# Patient Record
Sex: Female | Born: 1995 | Hispanic: Yes | Marital: Single | Smoking: Never smoker
Health system: Southern US, Community
[De-identification: ages and names within clinical notes are randomized; demographics above are authoritative.]

## PROBLEM LIST (undated history)

## (undated) DIAGNOSIS — N390 Urinary tract infection, site not specified: Secondary | ICD-10-CM

## (undated) DIAGNOSIS — M25361 Other instability, right knee: Secondary | ICD-10-CM

## (undated) DIAGNOSIS — J45909 Unspecified asthma, uncomplicated: Secondary | ICD-10-CM

## (undated) DIAGNOSIS — F419 Anxiety disorder, unspecified: Secondary | ICD-10-CM

## (undated) DIAGNOSIS — Z8619 Personal history of other infectious and parasitic diseases: Secondary | ICD-10-CM

## (undated) DIAGNOSIS — B009 Herpesviral infection, unspecified: Secondary | ICD-10-CM

## (undated) HISTORY — PX: WISDOM TOOTH EXTRACTION: SHX21

## (undated) HISTORY — PX: COLONOSCOPY: SHX174

---
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
## 2017-01-02 ENCOUNTER — Ambulatory Visit: Payer: Medicaid Other | Admitting: Physical Therapy

## 2017-01-02 DIAGNOSIS — M25661 Stiffness of right knee, not elsewhere classified: Secondary | ICD-10-CM

## 2017-01-02 DIAGNOSIS — R262 Difficulty in walking, not elsewhere classified: Secondary | ICD-10-CM

## 2017-01-02 DIAGNOSIS — R2689 Other abnormalities of gait and mobility: Secondary | ICD-10-CM

## 2017-01-02 DIAGNOSIS — M25361 Other instability, right knee: Secondary | ICD-10-CM

## 2017-01-02 DIAGNOSIS — R600 Localized edema: Secondary | ICD-10-CM

## 2017-01-02 DIAGNOSIS — M25561 Pain in right knee: Secondary | ICD-10-CM

## 2017-01-02 DIAGNOSIS — M6281 Muscle weakness (generalized): Secondary | ICD-10-CM

## 2017-01-02 NOTE — Therapy (Signed)
[place], [place], [address] Phone: [phone]   Fax:  [phone]  Physical Therapy Treatment  Patient Details  Name: Suzanne Wolfe MRN: [id number] Date of Birth: April 10, 1996 Referring Provider: Dannielle Karvonen rogers MD  Encounter Date: 01/02/2017      PT End of Session - 01/02/17 1620    Visit Number 13   Number of Visits 24   Date for PT Re-Evaluation 01/02/17   Authorization Type MCD   Authorization Time Period 11/21/2016-01/15/2017   Authorization - Visit Number 12   Authorization - Number of Visits 24   PT Start Time 6283   PT Stop Time 0343   PT Time Calculation (min) 46 min      Past Medical History:  Diagnosis Date  . History of Clostridium difficile infection 02/ 2018  resolve w/ antibiotic  . Mild asthma   . Patellar instability of right knee     Past Surgical History:  Procedure Laterality Date  . COLONOSCOPY  08-04-2016  dr stark  . ESOPHAGOGASTRODUODENOSCOPY  06/20/2016  . LAPAROSCOPIC APPENDECTOMY  07/16/2009  . MEDIAL PATELLOFEMORAL LIGAMENT REPAIR Right 10/31/2016   Procedure: Right knee arthroscopic assisted medial patella femoral ligament reconstruction;  Surgeon: Nicholes Stairs, MD;  Location: [facility];  Service: Orthopedics;  Laterality: Right;  . PILONIDAL CYST EXCISION N/A 07/02/2014   Procedure: EXCISION OF PILONIDAL CYST WITH PRIMARY CLOSURE;  Surgeon: Jerilynn Mages. Gerald Stabs, MD;  Location: [place];  Service: Pediatrics;  Laterality: N/A;  . PILONIDAL CYST EXCISION N/A 10/22/2014   Procedure: EXCISION PILONIDAL CYST AND SINUS;  Surgeon: Gerald Stabs, MD;  Location: [place];  Service: Pediatrics;  Laterality: N/A;  Re-excision  . WISDOM TOOTH EXTRACTION      There were no vitals filed for this visit.      Subjective Assessment - 01/02/17 1459    Subjective Only have pain if I walk alot like at the mall for an hour.    Currently in Pain? No/denies            [facility] PT Assessment - 01/02/17 0001      AROM   Left Knee Flexion 86  95 at end of treatment                      [facility] Adult PT Treatment/Exercise - 01/02/17 0001      Ambulation/Gait   Ambulation/Gait Yes   Pre-Gait Activities roll through and heel strike forward and retro gait in parallel bars      Knee/Hip Exercises: Stretches   Hip Flexor Stretch Right;2 reps;30 seconds   Knee: Self-Stretch Limitations scoot strech at edge of mat      Knee/Hip Exercises: Aerobic   Stationary Bike 4 minutes level 1      Knee/Hip Exercises: Machines for Strengthening   Cybex Leg Press 25# bilateral      Knee/Hip Exercises: Standing   Terminal Knee Extension Limitations 2 x 10   blue band 3 way    Forward Step Up 2 sets;10 reps;Right;Step Height: 6";Hand Hold: 1   Wall Squat 2 sets;15 reps   Gait Training forward/ retro walking in // 10 x   using mirror for cues and verbal cues to heel strike/ toe of     Knee/Hip Exercises: Seated   Sit to Sand 2 sets;10 reps  elevated seat      Knee/Hip Exercises: Supine   Straight Leg Raises 10 reps  Straight Leg Raises Limitations with initial quad set                  PT Short Term Goals - 12/06/16 1743      PT SHORT TERM GOAL #1   Title pt will be I with inital HEP (12/07/2016)   Time 4   Period Weeks     PT SHORT TERM GOAL #2   Title pt will increase R knee PROM / AAROM knee flexion to >/= 80 degrees with </= 6/10 for functional therapuetic progression (12/07/2016)   Baseline AROM 76,    Time 4   Period Weeks   Status Partially Met     PT SHORT TERM GOAL #3   Title pt will be able to elicit strong quad contraction and demo SLR wiht </= -15 extensor lag to demonstrate improvement knee function (12/07/2016)   Baseline quad lag with SLR   Time 4   Period Weeks   Status On-going     PT SHORT TERM GOAL #4   Title pt will be able to don/ doff her brace Independently    Period Weeks           PT Long Term Goals - 12/06/16 1744      PT LONG TERM GOAL #1   Title pt will increase knee flexion to >/= 120 degrees and </= -2 degrees with </= 2/10 pain for function and efficient gait pattern (01/02/2017)   Baseline 76 degrees flexion   Time 8   Period Weeks   Status On-going     PT LONG TERM GOAL #2   Title improve R knee/ hip strength to >/= 4/5 to provide hip/ knee stability with walking/ standing (01/02/2017)   Time 8   Period Weeks   Status On-going     PT LONG TERM GOAL #3   Title pt will be able to walk / standing for >/= 30 min and navigate up/down >/= 10 stairs reciprocally with </= +1HHA for safety for functional mobility reqired for school and work (01/02/2017)   Time 8   Period Weeks   Status On-going     PT LONG TERM GOAL #4   Title pt will be I with all HEP given as of last visit (01/02/2017)   Time 8   Period Weeks   Status On-going     PT LONG TERM GOAL #5   Title Pt will be able to stand on Rt. LE for min dynamic activity and no LOB, no pain.    Time 8   Period Weeks   Status On-going               Plan - 01/02/17 1501    Clinical Impression Statement Pt enters without AD and hinge brace. She sees MD Friday. SHe has decreased heel strike and lacks full extension with gait. Flexion ROM limited to 86 which has not improved over her two week  absence from PT. Worked on partial squats, terminal knee extension and gait mechanics. Reviewed flexion stretches and encouraged increased compliance. After stretches AROM flexion 95.    PT Next Visit Plan  mini step-up,  hip and ankle strength in sitting/ standing, leg press , knee mobs/ patellar mobs, continued stool scoots for knee flexion, cross friction massage, trial taping if scabs have cleared up.    PT Home Exercise Plan heel slide using strap (stay within pain free range), quad set, glute set, ankle pumps, standing heel raise, wall squat, heel strike/  toe off with crutch.    Consulted and Agree with Plan of Care Patient   Family Member Consulted Mother      Patient will benefit from skilled therapeutic intervention in order to improve the following deficits and impairments:  Decreased range of motion, Increased fascial restricitons, Pain, Impaired flexibility, Increased edema, Decreased strength, Decreased mobility, Decreased endurance, Decreased activity tolerance  Visit Diagnosis: Patellar instability of right knee  Muscle weakness (generalized)  Localized edema  Other abnormalities of gait and mobility  Stiffness of right knee, not elsewhere classified  Difficulty in walking, not elsewhere classified  Acute pain of right knee     Problem List Patient Active Problem List   Diagnosis Date Noted  . Patellar instability of right knee 10/31/2016  . Infected pilonidal cyst 10/22/2014  . Pilonidal sinus 07/02/2014  . Episodic tension-type headache, not intractable 12/04/2013  . Migraine without aura and without status migrainosus, not intractable 12/04/2013    Dorene Ar, PTA 01/02/2017, 4:20 PM  [facility] [address] [place], [place], [address] Phone: [phone]   Fax:  [phone]  Name: Suzanne Wolfe MRN: [id number] Date of Birth: 1996-02-16

## 2017-01-03 ENCOUNTER — Encounter: Payer: Self-pay | Admitting: Physical Therapy

## 2017-01-03 ENCOUNTER — Ambulatory Visit: Payer: Medicaid Other | Admitting: Physical Therapy

## 2017-01-03 DIAGNOSIS — R600 Localized edema: Secondary | ICD-10-CM

## 2017-01-03 DIAGNOSIS — M25361 Other instability, right knee: Secondary | ICD-10-CM

## 2017-01-03 DIAGNOSIS — M25661 Stiffness of right knee, not elsewhere classified: Secondary | ICD-10-CM

## 2017-01-03 DIAGNOSIS — R2689 Other abnormalities of gait and mobility: Secondary | ICD-10-CM

## 2017-01-03 DIAGNOSIS — M6281 Muscle weakness (generalized): Secondary | ICD-10-CM

## 2017-01-03 NOTE — Therapy (Signed)
[place], [place], [address] Phone: [phone]   Fax:  [phone]  Physical Therapy Treatment / Re-certification  Patient Details  Name: Suzanne Wolfe MRN: [id number] Date of Birth: 1995/06/21 Referring Provider: Dannielle Karvonen rogers MD  Encounter Date: 01/03/2017      PT End of Session - 01/03/17 1458    Visit Number 14   Number of Visits 24   Date for PT Re-Evaluation 02/14/17   Authorization Type MCD   PT Start Time 1459   PT Stop Time 1546   PT Time Calculation (min) 47 min   Activity Tolerance Patient tolerated treatment well   Behavior During Therapy [facility] for tasks assessed/performed      Past Medical History:  Diagnosis Date  . History of Clostridium difficile infection 02/ 2018  resolve w/ antibiotic  . Mild asthma   . Patellar instability of right knee     Past Surgical History:  Procedure Laterality Date  . COLONOSCOPY  08-04-2016  dr stark  . ESOPHAGOGASTRODUODENOSCOPY  06/20/2016  . LAPAROSCOPIC APPENDECTOMY  07/16/2009  . MEDIAL PATELLOFEMORAL LIGAMENT REPAIR Right 10/31/2016   Procedure: Right knee arthroscopic assisted medial patella femoral ligament reconstruction;  Surgeon: Nicholes Stairs, MD;  [facility]: [facility];  Service: Orthopedics;  Laterality: Right;  . PILONIDAL CYST EXCISION N/A 07/02/2014   Procedure: EXCISION OF PILONIDAL CYST WITH PRIMARY CLOSURE;  Surgeon: Jerilynn Mages. Gerald Stabs, MD;  Location: [place];  Service: Pediatrics;  Laterality: N/A;  . PILONIDAL CYST EXCISION N/A 10/22/2014   Procedure: EXCISION PILONIDAL CYST AND SINUS;  Surgeon: Gerald Stabs, MD;  Location: [place];  Service: Pediatrics;  Laterality: N/A;  Re-excision  . WISDOM TOOTH EXTRACTION      There were no vitals filed for this visit.      Subjective Assessment - 01/03/17 1459    Subjective "I am feeling sore today, and the inside of my knee  is still sensitive"    Currently in Pain? Yes   Pain Score 1    Pain Orientation Right   Pain Type Surgical pain   Pain Onset More than a month ago   Pain Frequency Intermittent   Aggravating Factors  deep knee bending, then going back to a straigh position   Pain Relieving Factors meds, ice            [facility] PT Assessment - 01/03/17 1516      AROM   Left Knee Extension -5   Left Knee Flexion 90  99     Strength   Right Knee Flexion 4-/5  pain during testing   Right Knee Extension 4/5                     [facility] Adult PT Treatment/Exercise - 01/03/17 1526      Knee/Hip Exercises: Stretches   Quad Stretch 3 reps;30 seconds     Knee/Hip Exercises: Aerobic   Stationary Bike 5 min level 1     Knee/Hip Exercises: Standing   Terminal Knee Extension Limitations 2 x 10   red theraband   Step Down 1 set;10 reps  cues for foot/ knee alignment     Knee/Hip Exercises: Seated   Heel Slides 2 sets;10 reps;AAROM  going as far as possible Active then use LLE to push more     Manual Therapy   Manual Therapy Other (comment)   Manual therapy comments DTM over the quadriceps using roller  how to perform at home   Other Manual Therapy scar tissue massage techniques  how to perform at home   McConnell Lateral > Medial                 PT Education - 01/03/17 1629    Education provided Yes   Education Details scar tissue cross friction massage techniques, DTM using rolling pin   Person(s) Educated Patient   Methods Explanation;Verbal cues   Comprehension Verbalized understanding;Verbal cues required          PT Short Term Goals - 01/03/17 1631      PT SHORT TERM GOAL #1   Title pt will be I with inital HEP (12/07/2016)   Time 4   Period Weeks   Status Achieved     PT SHORT TERM GOAL #2   Title pt will increase R knee PROM / AAROM knee flexion to >/= 80 degrees with </= 6/10 for functional therapuetic progression (12/07/2016)   Baseline 99 degrees  flexion   Time 4   Period Weeks   Status Achieved     PT SHORT TERM GOAL #3   Title pt will be able to elicit strong quad contraction and demo SLR wiht </= -15 extensor lag to demonstrate improvement knee function (12/07/2016)   Baseline able to elicit quad contraction but exhibits quad lag   Time 4   Period Weeks   Status Partially Met     PT SHORT TERM GOAL #4   Title pt will be able to don/ doff her brace Independently   Time 4   Period Weeks   Status Achieved           PT Long Term Goals - 01/03/17 1739      PT LONG TERM GOAL #1   Title pt will increase knee flexion to >/= 120 degrees and </= -2 degrees with </= 2/10 pain for function and efficient gait pattern (01/02/2017)   Baseline 99 degrees flexion, -6 degrees extension    Time 8   Period Weeks   Status On-going     PT LONG TERM GOAL #2   Title improve R knee/ hip strength to >/= 4/5 to provide hip/ knee stability with walking/ standing (01/02/2017)   Baseline 4-/5  knee flexion, 4/5 extension   Time 8   Period Weeks   Status Partially Met     PT LONG TERM GOAL #3   Title pt will be able to walk / standing for >/= 30 min and navigate up/down >/= 10 stairs reciprocally with </= +1HHA for safety for functional mobility reqired for school and work (01/02/2017)   Baseline difficulty going up/ down 10 stairs reciprocally, able to stand for 20 min with brace   Time 8   Period Weeks   Status Partially Met     PT LONG TERM GOAL #4   Title pt will be I with all HEP given as of last visit (01/02/2017)   Baseline continued with current HEP   Time 8   Period Weeks   Status On-going     PT LONG TERM GOAL #5   Title Pt will be able to stand on Rt. LE for min dynamic activity and no LOB, no pain.    Baseline unable to maintain SLS for 3 sec   Time 8   Period Weeks   Status On-going               Plan - 01/03/17 1629  Clinical Impression Statement pt continues to report soreness at 1/10 today. she improved  from 90 degrees to 99 in session. continued flexion mobs and stretching to promote mobility and strengthening which she performed well with minimal cues for motivation. She is progressing well toward goals. She would benefit from continued physical therapy to promote knee mobility, increase strength, maximize her function and meet remaining goals.    Rehab Potential Good   PT Frequency 2x / week   PT Duration 6 weeks   PT Treatment/Interventions ADLs/Self Care Home Management;Therapeutic exercise;Ultrasound;Cryotherapy;Electrical Stimulation;Iontophoresis 26m/ml Dexamethasone;Functional mobility training;Stair training;Neuromuscular re-education;Manual techniques;Patient/family education;Passive range of motion;Vasopneumatic Device;Therapeutic activities;Moist Heat   PT Next Visit Plan how was taping,  mini step-up,  hip and ankle strength in sitting/ standing, leg press , knee mobs/ patellar mobs, continued stool scoots for knee flexion,    PT Home Exercise Plan heel slide using strap (stay within pain free range), quad set, glute set, ankle pumps, standing heel raise, wall squat, heel strike/ toe off with crutch. taping for patella, cross friction massage   Consulted and Agree with Plan of Care Patient      Patient will benefit from skilled therapeutic intervention in order to improve the following deficits and impairments:  Decreased range of motion, Increased fascial restricitons, Pain, Impaired flexibility, Increased edema, Decreased strength, Decreased mobility, Decreased endurance, Decreased activity tolerance  Visit Diagnosis: Patellar instability of right knee - Plan: PT plan of care cert/re-cert  Muscle weakness (generalized) - Plan: PT plan of care cert/re-cert  Localized edema - Plan: PT plan of care cert/re-cert  Other abnormalities of gait and mobility - Plan: PT plan of care cert/re-cert  Stiffness of right knee, not elsewhere classified - Plan: PT plan of care  cert/re-cert     Problem List Patient Active Problem List   Diagnosis Date Noted  . Patellar instability of right knee 10/31/2016  . Infected pilonidal cyst 10/22/2014  . Pilonidal sinus 07/02/2014  . Episodic tension-type headache, not intractable 12/04/2013  . Migraine without aura and without status migrainosus, not intractable 12/04/2013   KStarr LakePT, DPT, LAT, ATC  01/03/17  5:47 PM      [facility] [address]Phone: [phone]  Fax:  [phone] Name: Suzanne GadberryMRN: [id number] of Birth: 115-Mar-1997

## 2017-01-08 ENCOUNTER — Ambulatory Visit: Payer: Medicaid Other | Admitting: Physical Therapy

## 2017-01-08 ENCOUNTER — Encounter: Payer: Self-pay | Admitting: Physical Therapy

## 2017-01-08 DIAGNOSIS — M6281 Muscle weakness (generalized): Secondary | ICD-10-CM

## 2017-01-08 DIAGNOSIS — R600 Localized edema: Secondary | ICD-10-CM

## 2017-01-08 DIAGNOSIS — R2689 Other abnormalities of gait and mobility: Secondary | ICD-10-CM

## 2017-01-08 DIAGNOSIS — M25361 Other instability, right knee: Secondary | ICD-10-CM

## 2017-01-08 NOTE — Therapy (Signed)
[place], [place], [address] Phone: [phone]   Fax:  [phone]  Physical Therapy Treatment  Patient Details  Name: Suzanne Wolfe MRN: [id number] Date of Birth: April 29, 1996 Referring Provider: Dannielle Karvonen rogers MD  Encounter Date: 01/08/2017      PT End of Session - 01/08/17 1541    Visit Number 15   Number of Visits 24   Date for PT Re-Evaluation 02/14/17   Authorization - Visit Number 15   Authorization - Number of Visits 24   PT Start Time 5397   PT Stop Time 1629   PT Time Calculation (min) 46 min   Activity Tolerance Patient tolerated treatment well   Behavior During Therapy [facility] for tasks assessed/performed      Past Medical History:  Diagnosis Date  . History of Clostridium difficile infection 02/ 2018  resolve w/ antibiotic  . Mild asthma   . Patellar instability of right knee     Past Surgical History:  Procedure Laterality Date  . COLONOSCOPY  08-04-2016  dr stark  . ESOPHAGOGASTRODUODENOSCOPY  06/20/2016  . LAPAROSCOPIC APPENDECTOMY  07/16/2009  . MEDIAL PATELLOFEMORAL LIGAMENT REPAIR Right 10/31/2016   Procedure: Right knee arthroscopic assisted medial patella femoral ligament reconstruction;  Surgeon: Nicholes Stairs, MD;  Location[facility];  Service: Orthopedics;  Laterality: Right;  . PILONIDAL CYST EXCISION N/A 07/02/2014   Procedure: EXCISION OF PILONIDAL CYST WITH PRIMARY CLOSURE;  Surgeon: Jerilynn Mages. Gerald Stabs, MD;  Location: [place];  Service: Pediatrics;  Laterality: N/A;  . PILONIDAL CYST EXCISION N/A 10/22/2014   Procedure: EXCISION PILONIDAL CYST AND SINUS;  Surgeon: Gerald Stabs, MD;  Location: [place];  Service: Pediatrics;  Laterality: N/A;  Re-excision  . WISDOM TOOTH EXTRACTION      There were no vitals filed for this visit.      Subjective Assessment - 01/08/17 1541    Subjective "I don't have any pain  today, and the sensitive is doing better"    Currently in Pain? No/denies                         [facility] Adult PT Treatment/Exercise - 01/08/17 1546      Knee/Hip Exercises: Aerobic   Stationary Bike L3 x 5 min     Knee/Hip Exercises: Standing   Terminal Knee Extension Limitations 2 x 10   with blue theraband   Forward Step Up 2 sets;10 reps;Right;Hand Hold: 1;Step Height: 6"   Step Down 2 sets;Step Height: 4"     Knee/Hip Exercises: Seated   Long Arc Quad Strengthening;Left;2 sets;10 reps   Long Arc Quad Weight 4 lbs.   Sit to Sand 2 sets;10 reps  touching to table before standing     Manual Therapy   Manual therapy comments manual trigger point release over the quad x 4, DTM over the distal quad   Joint Mobilization grade 5 Long axis distraction    Soft tissue mobilization massage roller over R quad, and IASTM techniques                  PT Short Term Goals - 01/03/17 1631      PT SHORT TERM GOAL #1   Title pt will be I with inital HEP (12/07/2016)   Time 4   Period Weeks   Status Achieved     PT SHORT TERM GOAL #2   Title pt will increase  R knee PROM / AAROM knee flexion to >/= 80 degrees with </= 6/10 for functional therapuetic progression (12/07/2016)   Baseline 99 degrees flexion   Time 4   Period Weeks   Status Achieved     PT SHORT TERM GOAL #3   Title pt will be able to elicit strong quad contraction and demo SLR wiht </= -15 extensor lag to demonstrate improvement knee function (12/07/2016)   Baseline able to elicit quad contraction but exhibits quad lag   Time 4   Period Weeks   Status Partially Met     PT SHORT TERM GOAL #4   Title pt will be able to don/ doff her brace Independently   Time 4   Period Weeks   Status Achieved           PT Long Term Goals - 01/03/17 1739      PT LONG TERM GOAL #1   Title pt will increase knee flexion to >/= 120 degrees and </= -2 degrees with </= 2/10 pain for function and efficient gait  pattern (01/02/2017)   Baseline 99 degrees flexion, -6 degrees extension    Time 8   Period Weeks   Status On-going     PT LONG TERM GOAL #2   Title improve R knee/ hip strength to >/= 4/5 to provide hip/ knee stability with walking/ standing (01/02/2017)   Baseline 4-/5  knee flexion, 4/5 extension   Time 8   Period Weeks   Status Partially Met     PT LONG TERM GOAL #3   Title pt will be able to walk / standing for >/= 30 min and navigate up/down >/= 10 stairs reciprocally with </= +1HHA for safety for functional mobility reqired for school and work (01/02/2017)   Baseline difficulty going up/ down 10 stairs reciprocally, able to stand for 20 min with brace   Time 8   Period Weeks   Status Partially Met     PT LONG TERM GOAL #4   Title pt will be I with all HEP given as of last visit (01/02/2017)   Baseline continued with current HEP   Time 8   Period Weeks   Status On-going     PT LONG TERM GOAL #5   Title Pt will be able to stand on Rt. LE for min dynamic activity and no LOB, no pain.    Baseline unable to maintain SLS for 3 sec   Time 8   Period Weeks   Status On-going               Plan - 01/08/17 1640    Clinical Impression Statement pt reports no pain and decreased medial sensitivitity of the knee along the port sites. continued soft tissue work along the quad and stretching techniques to improve knee fleixon. she was able to perform quad strength activities with report of feeling nervous about her knee shaking but was able to complete the exercise with verbal cues for confidence.    PT Next Visit Plan  mini step-up,  hip and ankle strength in sitting/ standing, leg press , knee mobs/ patellar mobs, continued stool scoots for knee flexion,    PT Home Exercise Plan heel slide using strap (stay within pain free range), quad set, glute set, ankle pumps, standing heel raise, wall squat, heel strike/ toe off with crutch. taping for patella, cross friction massage    Consulted and Agree with Plan of Care Patient  Patient will benefit from skilled therapeutic intervention in order to improve the following deficits and impairments:  Decreased range of motion, Increased fascial restricitons, Pain, Impaired flexibility, Increased edema, Decreased strength, Decreased mobility, Decreased endurance, Decreased activity tolerance  Visit Diagnosis: Patellar instability of right knee  Localized edema  Muscle weakness (generalized)  Other abnormalities of gait and mobility     Problem List Patient Active Problem List   Diagnosis Date Noted  . Patellar instability of right knee 10/31/2016  . Infected pilonidal cyst 10/22/2014  . Pilonidal sinus 07/02/2014  . Episodic tension-type headache, not intractable 12/04/2013  . Migraine without aura and without status migrainosus, not intractable 12/04/2013    Starr Lake PT, DPT, LAT, ATC  01/08/17  5:23 PM       [place] [facility] [address] [place], [place], [address] Phone: [phone]   Fax:  [phone]  Name: Suzanne Wolfe MRN: [id number] Date of Birth: 10-18-1995

## 2017-01-10 ENCOUNTER — Ambulatory Visit: Payer: Medicaid Other | Admitting: Physical Therapy

## 2017-01-10 ENCOUNTER — Encounter: Payer: Self-pay | Admitting: Physical Therapy

## 2017-01-10 DIAGNOSIS — M6281 Muscle weakness (generalized): Secondary | ICD-10-CM

## 2017-01-10 DIAGNOSIS — R600 Localized edema: Secondary | ICD-10-CM

## 2017-01-10 DIAGNOSIS — R2689 Other abnormalities of gait and mobility: Secondary | ICD-10-CM

## 2017-01-10 DIAGNOSIS — M25661 Stiffness of right knee, not elsewhere classified: Secondary | ICD-10-CM

## 2017-01-10 DIAGNOSIS — M25361 Other instability, right knee: Secondary | ICD-10-CM

## 2017-01-10 NOTE — Therapy (Signed)
[place], [place], [address] Phone: [phone]   Fax:  [phone]  Physical Therapy Treatment  Patient Details  Name: Suzanne Wolfe MRN: [id number] Date of Birth: [birth year] Referring Provider: Dannielle Karvonen rogers MD  Encounter Date: 01/10/2017      PT End of Session - 01/10/17 1626    Visit Number 16   Number of Visits 24   Date for PT Re-Evaluation 02/14/17   Authorization Type MCD   PT Start Time 1545   PT Stop Time 1631   PT Time Calculation (min) 46 min   Activity Tolerance Patient tolerated treatment well   Behavior During Therapy [facility] for tasks assessed/performed      Past Medical History:  Diagnosis Date  . History of Clostridium difficile infection 02/ 2018  resolve w/ antibiotic  . Mild asthma   . Patellar instability of right knee     Past Surgical History:  Procedure Laterality Date  . COLONOSCOPY  08-04-2016  dr stark  . ESOPHAGOGASTRODUODENOSCOPY  06/20/2016  . LAPAROSCOPIC APPENDECTOMY  07/16/2009  . MEDIAL PATELLOFEMORAL LIGAMENT REPAIR Right 10/31/2016   Procedure: Right knee arthroscopic assisted medial patella femoral ligament reconstruction;  Surgeon: Nicholes Stairs, MD;  Location: [facility];  Service: Orthopedics;  Laterality: Right;  . PILONIDAL CYST EXCISION N/A 07/02/2014   Procedure: EXCISION OF PILONIDAL CYST WITH PRIMARY CLOSURE;  Surgeon: Jerilynn Mages. Gerald Stabs, MD;  Location: [place];  Service: Pediatrics;  Laterality: N/A;  . PILONIDAL CYST EXCISION N/A 10/22/2014   Procedure: EXCISION PILONIDAL CYST AND SINUS;  Surgeon: Gerald Stabs, MD;  Location: [place];  Service: Pediatrics;  Laterality: N/A;  Re-excision  . WISDOM TOOTH EXTRACTION      There were no vitals filed for this visit.      Subjective Assessment - 01/10/17 1555    Subjective "I'm not too sore today"    Currently in Pain? No/denies             [facility] PT Assessment - 01/10/17 0001      AROM   Right Knee Flexion 107  115 post session   Left Knee Extension 3                     [facility] Adult PT Treatment/Exercise - 01/10/17 1555      Knee/Hip Exercises: Stretches   Quad Stretch 3 reps;30 seconds     Knee/Hip Exercises: Aerobic   Stationary Bike L3 x 5 min     Knee/Hip Exercises: Machines for Strengthening   Cybex Knee Extension 2 x 10 20#   Cybex Knee Flexion 2 x 10 20# bil LE   Cybex Leg Press 1 x 15 25#, 1 x 15 35#,      Knee/Hip Exercises: Standing   Terminal Knee Extension Limitations 2 x 10    Forward Step Up 2 sets;10 reps;Right;Hand Hold: 1;Step Height: 6"   Step Down 2 sets;Step Height: 6"     Knee/Hip Exercises: Seated   Heel Slides 2 sets;10 reps;AAROM     Manual Therapy   Joint Mobilization grade 5 Long axis distraction    Soft tissue mobilization massage roller over R quad, and IASTM techniques                  PT Short Term Goals - 01/03/17 1631      PT SHORT TERM GOAL #1   Title pt will be I with inital  HEP (12/07/2016)   Time 4   Period Weeks   Status Achieved     PT SHORT TERM GOAL #2   Title pt will increase R knee PROM / AAROM knee flexion to >/= 80 degrees with </= 6/10 for functional therapuetic progression (12/07/2016)   Baseline 99 degrees flexion   Time 4   Period Weeks   Status Achieved     PT SHORT TERM GOAL #3   Title pt will be able to elicit strong quad contraction and demo SLR wiht </= -15 extensor lag to demonstrate improvement knee function (12/07/2016)   Baseline able to elicit quad contraction but exhibits quad lag   Time 4   Period Weeks   Status Partially Met     PT SHORT TERM GOAL #4   Title pt will be able to don/ doff her brace Independently   Time 4   Period Weeks   Status Achieved           PT Long Term Goals - 01/03/17 1739      PT LONG TERM GOAL #1   Title pt will increase knee flexion to >/= 120 degrees and </= -2 degrees  with </= 2/10 pain for function and efficient gait pattern (01/02/2017)   Baseline 99 degrees flexion, -6 degrees extension    Time 8   Period Weeks   Status On-going     PT LONG TERM GOAL #2   Title improve R knee/ hip strength to >/= 4/5 to provide hip/ knee stability with walking/ standing (01/02/2017)   Baseline 4-/5  knee flexion, 4/5 extension   Time 8   Period Weeks   Status Partially Met     PT LONG TERM GOAL #3   Title pt will be able to walk / standing for >/= 30 min and navigate up/down >/= 10 stairs reciprocally with </= +1HHA for safety for functional mobility reqired for school and work (01/02/2017)   Baseline difficulty going up/ down 10 stairs reciprocally, able to stand for 20 min with brace   Time 8   Period Weeks   Status Partially Met     PT LONG TERM GOAL #4   Title pt will be I with all HEP given as of last visit (01/02/2017)   Baseline continued with current HEP   Time 8   Period Weeks   Status On-going     PT LONG TERM GOAL #5   Title Pt will be able to stand on Rt. LE for min dynamic activity and no LOB, no pain.    Baseline unable to maintain SLS for 3 sec   Time 8   Period Weeks   Status On-going               Plan - 01/10/17 1626    Clinical Impression Statement Greenley contiues to make progress with knee mobility increasing to 115 degrees today. continued working on quad strength and hip strength which she peroformed well. post session  she reported no pain.    PT Treatment/Interventions ADLs/Self Care Home Management;Therapeutic exercise;Ultrasound;Cryotherapy;Electrical Stimulation;Iontophoresis 10m/ml Dexamethasone;Functional mobility training;Stair training;Neuromuscular re-education;Manual techniques;Patient/family education;Passive range of motion;Vasopneumatic Device;Therapeutic activities;Moist Heat   PT Next Visit Plan  mini step-up,  hip and ankle strength in sitting/ standing, leg press , knee mobs/ patellar mobs, continued stool scoots  for knee flexion,    PT Home Exercise Plan heel slide using strap (stay within pain free range), quad set, glute set, ankle pumps, standing heel raise, wall squat,  heel strike/ toe off with crutch. taping for patella, cross friction massage   Consulted and Agree with Plan of Care Patient      Patient will benefit from skilled therapeutic intervention in order to improve the following deficits and impairments:  Decreased range of motion, Increased fascial restricitons, Pain, Impaired flexibility, Increased edema, Decreased strength, Decreased mobility, Decreased endurance, Decreased activity tolerance  Visit Diagnosis: Patellar instability of right knee  Localized edema  Muscle weakness (generalized)  Other abnormalities of gait and mobility  Stiffness of right knee, not elsewhere classified     Problem List Patient Active Problem List   Diagnosis Date Noted  . Patellar instability of right knee 10/31/2016  . Infected pilonidal cyst 10/22/2014  . Pilonidal sinus 07/02/2014  . Episodic tension-type headache, not intractable 12/04/2013  . Migraine without aura and without status migrainosus, not intractable 12/04/2013   Starr Lake PT, DPT, LAT, ATC  01/10/17  4:29 PM      [place] [address] [place], [place], [address] Phone: [phone]   Fax:  [phone]  Name: Suzanne Wolfe MRN: [id number] Date of Birth: 1995/10/07

## 2017-01-17 ENCOUNTER — Ambulatory Visit: Payer: Medicaid Other | Attending: Orthopedic Surgery | Admitting: Physical Therapy

## 2017-01-17 ENCOUNTER — Encounter: Payer: Self-pay | Admitting: Physical Therapy

## 2017-01-17 DIAGNOSIS — M25561 Pain in right knee: Secondary | ICD-10-CM | POA: Diagnosis present

## 2017-01-17 DIAGNOSIS — M25361 Other instability, right knee: Secondary | ICD-10-CM

## 2017-01-17 DIAGNOSIS — M6281 Muscle weakness (generalized): Secondary | ICD-10-CM | POA: Insufficient documentation

## 2017-01-17 DIAGNOSIS — M25661 Stiffness of right knee, not elsewhere classified: Secondary | ICD-10-CM | POA: Diagnosis present

## 2017-01-17 DIAGNOSIS — R2689 Other abnormalities of gait and mobility: Secondary | ICD-10-CM

## 2017-01-17 DIAGNOSIS — R600 Localized edema: Secondary | ICD-10-CM | POA: Diagnosis present

## 2017-01-17 DIAGNOSIS — R262 Difficulty in walking, not elsewhere classified: Secondary | ICD-10-CM

## 2017-01-17 NOTE — Therapy (Signed)
[place], [place], [address] Phone: [phone]   Fax:  [phone]  Physical Therapy Treatment  Patient Details  Name: Suzanne Wolfe MRN: [id number] Date of Birth: Oct 31, 1995 Referring Provider: Dannielle Karvonen rogers MD  Encounter Date: 01/17/2017      PT End of Session - 01/17/17 1634    Visit Number 17   Number of Visits 24   Date for PT Re-Evaluation 02/14/17   Authorization - Visit Number 11   Authorization - Number of Visits 24   PT Start Time 6962   PT Stop Time 1630   PT Time Calculation (min) 43 min   Activity Tolerance Patient tolerated treatment well   Behavior During Therapy [facility] for tasks assessed/performed      Past Medical History:  Diagnosis Date  . History of Clostridium difficile infection 02/ 2018  resolve w/ antibiotic  . Mild asthma   . Patellar instability of right knee     Past Surgical History:  Procedure Laterality Date  . COLONOSCOPY  08-04-2016  dr stark  . ESOPHAGOGASTRODUODENOSCOPY  06/20/2016  . LAPAROSCOPIC APPENDECTOMY  07/16/2009  . MEDIAL PATELLOFEMORAL LIGAMENT REPAIR Right 10/31/2016   Procedure: Right knee arthroscopic assisted medial patella femoral ligament reconstruction;  Surgeon: Nicholes Stairs, MD;  Location: [facility];  Service: Orthopedics;  Laterality: Right;  . PILONIDAL CYST EXCISION N/A 07/02/2014   Procedure: EXCISION OF PILONIDAL CYST WITH PRIMARY CLOSURE;  Surgeon: Jerilynn Mages. Gerald Stabs, MD;  Location: [place];  Service: Pediatrics;  Laterality: N/A;  . PILONIDAL CYST EXCISION N/A 10/22/2014   Procedure: EXCISION PILONIDAL CYST AND SINUS;  Surgeon: Gerald Stabs, MD;  Location: [place];  Service: Pediatrics;  Laterality: N/A;  Re-excision  . WISDOM TOOTH EXTRACTION      There were no vitals filed for this visit.      Subjective Assessment - 01/17/17 1554    Subjective A little sore from not  wearing my brace.  Last wore brace 2 days ago.  She uses brace if she is in community for awhile.   She is able to go to store and buy milk without the brace.     Currently in Pain? Yes   Pain Score 3    Pain Location Knee   Pain Orientation Right;Anterior;Medial   Pain Descriptors / Indicators Aching;Sore   Pain Type Surgical pain   Pain Frequency Intermittent   Aggravating Factors  end range  bending  in quads,  (Quads hurt more than knee)   Pain Relieving Factors ibuprophen,  does not need everyday   Effect of Pain on Daily Activities Not yet allowed to work                         [facility] Adult PT Treatment/Exercise - 01/17/17 0001      Knee/Hip Exercises: Stretches   Passive Hamstring Stretch 3 reps;30 seconds   Quad Stretch 3 reps;30 seconds     Knee/Hip Exercises: Seated   Heel Slides 2 sets;10 reps  1 set with green strap assist.   Stool Scoot - Round Trips stool scoot40 feet  small discomfort stool scoot  behind knee.calf burning     Knee/Hip Exercises: Prone   Other Prone Exercises quad stretch 10 x 10 seconds with strap     Modalities   Modalities Moist Heat     Moist Heat Therapy   Number Minutes Moist Heat --  concurrent with manual,  multiple minutes quads, lateral thi     Manual Therapy   Manual Therapy --  moist heat concurrent to manual.  thigh   Joint Mobilization self mobs with towel P/A mobs to increase flexion.    Soft tissue mobilization roller massage, patellar mobs slow gentle stretches  soft tissue work medial laterak thigh                PT Education - 01/17/17 8372    Education provided Yes   Education Details Get tissues warm prior to stretch   Person(s) Educated Patient   Methods Explanation   Comprehension Verbalized understanding          PT Short Term Goals - 01/03/17 1631      PT SHORT TERM GOAL #1   Title pt will be I with inital HEP (12/07/2016)   Time 4   Period Weeks   Status Achieved     PT SHORT  TERM GOAL #2   Title pt will increase R knee PROM / AAROM knee flexion to >/= 80 degrees with </= 6/10 for functional therapuetic progression (12/07/2016)   Baseline 99 degrees flexion   Time 4   Period Weeks   Status Achieved     PT SHORT TERM GOAL #3   Title pt will be able to elicit strong quad contraction and demo SLR wiht </= -15 extensor lag to demonstrate improvement knee function (12/07/2016)   Baseline able to elicit quad contraction but exhibits quad lag   Time 4   Period Weeks   Status Partially Met     PT SHORT TERM GOAL #4   Title pt will be able to don/ doff her brace Independently   Time 4   Period Weeks   Status Achieved           PT Long Term Goals - 01/03/17 1739      PT LONG TERM GOAL #1   Title pt will increase knee flexion to >/= 120 degrees and </= -2 degrees with </= 2/10 pain for function and efficient gait pattern (01/02/2017)   Baseline 99 degrees flexion, -6 degrees extension    Time 8   Period Weeks   Status On-going     PT LONG TERM GOAL #2   Title improve R knee/ hip strength to >/= 4/5 to provide hip/ knee stability with walking/ standing (01/02/2017)   Baseline 4-/5  knee flexion, 4/5 extension   Time 8   Period Weeks   Status Partially Met     PT LONG TERM GOAL #3   Title pt will be able to walk / standing for >/= 30 min and navigate up/down >/= 10 stairs reciprocally with </= +1HHA for safety for functional mobility reqired for school and work (01/02/2017)   Baseline difficulty going up/ down 10 stairs reciprocally, able to stand for 20 min with brace   Time 8   Period Weeks   Status Partially Met     PT LONG TERM GOAL #4   Title pt will be I with all HEP given as of last visit (01/02/2017)   Baseline continued with current HEP   Time 8   Period Weeks   Status On-going     PT LONG TERM GOAL #5   Title Pt will be able to stand on Rt. LE for min dynamic activity and no LOB, no pain.    Baseline unable to maintain SLS for 3 sec   Time  8  Period Weeks   Status On-going               Plan - 01/17/17 1738    Clinical Impression Statement 115 PROM.  Heavy heat and soft tissue work.  Pain decreased from 3/10 to 0/10 at end of session.  Patella mobility improved with mobs.  Quads stretching well tolerated today.     PT Treatment/Interventions ADLs/Self Care Home Management;Therapeutic exercise;Ultrasound;Cryotherapy;Electrical Stimulation;Iontophoresis 62m/ml Dexamethasone;Functional mobility training;Stair training;Neuromuscular re-education;Manual techniques;Patient/family education;Passive range of motion;Vasopneumatic Device;Therapeutic activities;Moist Heat   PT Next Visit Plan  mini step-up,  hip and ankle strength in sitting/ standing, leg press , knee mobs/ patellar mobs, continued stool scoots for knee flexion,    PT Home Exercise Plan heel slide using strap (stay within pain free range), quad set, glute set, ankle pumps, standing heel raise, wall squat, heel strike/ toe off with crutch. taping for patella, cross friction massage   Consulted and Agree with Plan of Care Patient      Patient will benefit from skilled therapeutic intervention in order to improve the following deficits and impairments:  Decreased range of motion, Increased fascial restricitons, Pain, Impaired flexibility, Increased edema, Decreased strength, Decreased mobility, Decreased endurance, Decreased activity tolerance  Visit Diagnosis: Patellar instability of right knee  Localized edema  Muscle weakness (generalized)  Other abnormalities of gait and mobility  Stiffness of right knee, not elsewhere classified  Difficulty in walking, not elsewhere classified  Acute pain of right knee     Problem List Patient Active Problem List   Diagnosis Date Noted  . Patellar instability of right knee 10/31/2016  . Infected pilonidal cyst 10/22/2014  . Pilonidal sinus 07/02/2014  . Episodic tension-type headache, not intractable 12/04/2013   . Migraine without aura and without status migrainosus, not intractable 12/04/2013    HARRIS,KAREN PTA 01/17/2017, 5:51 PM  [facility] [address]Phone: [phone]  Fax:  [phone] Name: AGalilea QuitoMRN: [id number] of Birth: [birth year]

## 2017-01-18 ENCOUNTER — Encounter: Payer: Medicaid Other | Admitting: Physical Therapy

## 2017-01-24 ENCOUNTER — Encounter: Payer: Self-pay | Admitting: Physical Therapy

## 2017-01-24 ENCOUNTER — Ambulatory Visit: Payer: Medicaid Other | Admitting: Physical Therapy

## 2017-01-24 DIAGNOSIS — M6281 Muscle weakness (generalized): Secondary | ICD-10-CM

## 2017-01-24 DIAGNOSIS — R2689 Other abnormalities of gait and mobility: Secondary | ICD-10-CM

## 2017-01-24 DIAGNOSIS — M25361 Other instability, right knee: Secondary | ICD-10-CM | POA: Diagnosis not present

## 2017-01-24 DIAGNOSIS — R262 Difficulty in walking, not elsewhere classified: Secondary | ICD-10-CM

## 2017-01-24 DIAGNOSIS — M25661 Stiffness of right knee, not elsewhere classified: Secondary | ICD-10-CM

## 2017-01-24 DIAGNOSIS — M25561 Pain in right knee: Secondary | ICD-10-CM

## 2017-01-24 DIAGNOSIS — R600 Localized edema: Secondary | ICD-10-CM

## 2017-01-24 NOTE — Therapy (Signed)
[place], [place], [address] Phone: [phone]   Fax:  [phone]  Physical Therapy Treatment  Patient Details  Name: Suzanne Wolfe MRN: [id number] Date of Birth: December 09, 1995 Referring Provider: Dannielle Karvonen rogers MD  Encounter Date: 01/24/2017    Past Medical History:  Diagnosis Date  . History of Clostridium difficile infection 02/ 2018  resolve w/ antibiotic  . Mild asthma   . Patellar instability of right knee     Past Surgical History:  Procedure Laterality Date  . COLONOSCOPY  08-04-2016  dr stark  . ESOPHAGOGASTRODUODENOSCOPY  06/20/2016  . LAPAROSCOPIC APPENDECTOMY  07/16/2009  . MEDIAL PATELLOFEMORAL LIGAMENT REPAIR Right 10/31/2016   Procedure: Right knee arthroscopic assisted medial patella femoral ligament reconstruction;  Surgeon: Nicholes Stairs, MD;  Location: [facility];  Service: Orthopedics;  Laterality: Right;  . PILONIDAL CYST EXCISION N/A 07/02/2014   Procedure: EXCISION OF PILONIDAL CYST WITH PRIMARY CLOSURE;  Surgeon: Jerilynn Mages. Gerald Stabs, MD;  Location: [place];  Service: Pediatrics;  Laterality: N/A;  . PILONIDAL CYST EXCISION N/A 10/22/2014   Procedure: EXCISION PILONIDAL CYST AND SINUS;  Surgeon: Gerald Stabs, MD;  Location: [place];  Service: Pediatrics;  Laterality: N/A;  Re-excision  . WISDOM TOOTH EXTRACTION      There were no vitals filed for this visit.      Subjective Assessment - 01/24/17 1503    Subjective A little pain anterior knee. 3/10.  She was walking in [place] without her brace an hour and it started hurting while she was there.  She has been doing her exercise.    Currently in Pain? Yes   Pain Score 3    Pain Location Knee   Pain Orientation Right;Anterior;Medial   Pain Descriptors / Indicators Aching;Sore   Pain Frequency Intermittent   Aggravating Factors  longer walking without brace.  stretching,   (Quads hurt more than knee)   Pain Relieving Factors ibuprophen, not every day   Effect of Pain on Daily Activities walking limeted to about an hour.  Not yet back to work, school.                         [facility] Adult PT Treatment/Exercise - 01/24/17 0001      Knee/Hip Exercises: Machines for Strengthening   Cybex Leg Press 1 X 15, 25 LBS,  1 X  15, 35 LBS     Knee/Hip Exercises: Standing   Forward Step Up 2 sets;10 reps;Hand Hold: 1;Step Height: 4"   Forward Step Up Limitations 6 inches painful lateral patella   SLS 1 X 21 seconds,  wobbles        Knee/Hip Exercises: Seated   Long Arc Quad 20 reps   Long Arc Quad Weight 0 lbs.   Heel Slides 10 reps   Stool Scoot - Round Trips stool scoot  60 feet.    noted lateral patellar pain.  cued for straighter knee.      Knee/Hip Exercises: Supine   Quad Sets 20 reps  4.5/10 pain   Straight Leg Raises 20 reps   Straight Leg Raises Limitations No quad lag today     Cryotherapy   Number Minutes Cryotherapy 10 Minutes   Cryotherapy Location Knee     Manual Therapy   Soft tissue mobilization scar tissue mobs,  taught patient.  less pin with SLR post scar tissue mobs.  PT Short Term Goals - 01/24/17 1539      PT SHORT TERM GOAL #1   Title pt will be I with inital HEP (12/07/2016)   Baseline independent   Time 4   Period Weeks   Status Achieved     PT SHORT TERM GOAL #2   Title pt will increase R knee PROM / AAROM knee flexion to >/= 80 degrees with </= 6/10 for functional therapuetic progression (12/07/2016)   Time 4   Period Weeks   Status Achieved     PT SHORT TERM GOAL #3   Title pt will be able to elicit strong quad contraction and demo SLR wiht </= -15 extensor lag to demonstrate improvement knee function (12/07/2016)   Baseline quad contraction not strong.  No quad lag   Time 4   Status Partially Met     PT SHORT TERM GOAL #4   Title pt will be able to don/ doff her brace  Independently   Time 4   Period Weeks   Status Achieved           PT Long Term Goals - 01/24/17 1540      PT LONG TERM GOAL #3   Title Able to walk/stand with pain after 30 minutes with 4/10 pain.  After walking/standing 1 hour pain is 5/10.  Stairs continue to be difficult     PT LONG TERM GOAL #5   Title Pt will be able to stand on Rt. LE for min dynamic activity and no LOB, no pain.                Plan - 01/24/17 1603    Clinical Impression Statement 115 PROM best. strengthening/  ROM focus today.  balance improving with 21 seconds SLS.  No quad lag with SLR today. Heat is helpful  with stretches. Patient is more functional in community with walking in [place] 1 hour with 5/10 pain and no brace.    PT Treatment/Interventions ADLs/Self Care Home Management;Therapeutic exercise;Ultrasound;Cryotherapy;Electrical Stimulation;Iontophoresis 61m/ml Dexamethasone;Functional mobility training;Stair training;Neuromuscular re-education;Manual techniques;Patient/family education;Passive range of motion;Vasopneumatic Device;Therapeutic activities;Moist Heat   PT Next Visit Plan  mini step-up,  hip and ankle strength in sitting/ standing, leg press , knee mobs/ patellar mobs, continued stool scoots for knee flexion,    PT Home Exercise Plan heel slide using strap (stay within pain free range), quad set, glute set, ankle pumps, standing heel raise, wall squat, heel strike/ toe off with crutch. taping for patella, cross friction massage   Consulted and Agree with Plan of Care Patient      Patient will benefit from skilled therapeutic intervention in order to improve the following deficits and impairments:  Decreased range of motion, Increased fascial restricitons, Pain, Impaired flexibility, Increased edema, Decreased strength, Decreased mobility, Decreased endurance, Decreased activity tolerance  Visit Diagnosis: Patellar instability of right knee  Localized edema  Muscle weakness  (generalized)  Other abnormalities of gait and mobility  Stiffness of right knee, not elsewhere classified  Difficulty in walking, not elsewhere classified  Acute pain of right knee     Problem List Patient Active Problem List   Diagnosis Date Noted  . Patellar instability of right knee 10/31/2016  . Infected pilonidal cyst 10/22/2014  . Pilonidal sinus 07/02/2014  . Episodic tension-type headache, not intractable 12/04/2013  . Migraine without aura and without status migrainosus, not intractable 12/04/2013    HARRIS,KAREN   PTA 01/24/2017, 4:06 PM  [facility]  [place], [place], [address] Phone: [phone]   Fax:  [phone]  Name: Suzanne Wolfe MRN: [id number] Date of Birth: 1996-05-07

## 2017-01-25 ENCOUNTER — Encounter: Payer: Self-pay | Admitting: Physical Therapy

## 2017-01-25 ENCOUNTER — Ambulatory Visit: Payer: Medicaid Other | Admitting: Physical Therapy

## 2017-01-25 DIAGNOSIS — R262 Difficulty in walking, not elsewhere classified: Secondary | ICD-10-CM

## 2017-01-25 DIAGNOSIS — M6281 Muscle weakness (generalized): Secondary | ICD-10-CM

## 2017-01-25 DIAGNOSIS — R600 Localized edema: Secondary | ICD-10-CM

## 2017-01-25 DIAGNOSIS — R2689 Other abnormalities of gait and mobility: Secondary | ICD-10-CM

## 2017-01-25 DIAGNOSIS — M25361 Other instability, right knee: Secondary | ICD-10-CM | POA: Diagnosis not present

## 2017-01-25 DIAGNOSIS — M25561 Pain in right knee: Secondary | ICD-10-CM

## 2017-01-25 DIAGNOSIS — M25661 Stiffness of right knee, not elsewhere classified: Secondary | ICD-10-CM

## 2017-01-25 NOTE — Therapy (Signed)
[place], [place], [address] Phone: [phone]   Fax:  [phone]  Physical Therapy Treatment  Patient Details  Name: Suzanne Wolfe MRN: [id number] Date of Birth: May 21, 1995 Referring Provider: Dannielle Karvonen rogers MD  Encounter Date: 01/25/2017      PT End of Session - 01/25/17 1623    Visit Number 18   Number of Visits 24   Date for PT Re-Evaluation 02/14/17   Authorization - Visit Number 52   Authorization - Number of Visits 24   PT Start Time 1330   PT Stop Time 1416   PT Time Calculation (min) 46 min   Activity Tolerance Patient tolerated treatment well   Behavior During Therapy [facility] for tasks assessed/performed      Past Medical History:  Diagnosis Date  . History of Clostridium difficile infection 02/ 2018  resolve w/ antibiotic  . Mild asthma   . Patellar instability of right knee     Past Surgical History:  Procedure Laterality Date  . COLONOSCOPY  08-04-2016  dr stark  . ESOPHAGOGASTRODUODENOSCOPY  06/20/2016  . LAPAROSCOPIC APPENDECTOMY  07/16/2009  . MEDIAL PATELLOFEMORAL LIGAMENT REPAIR Right 10/31/2016   Procedure: Right knee arthroscopic assisted medial patella femoral ligament reconstruction;  Surgeon: Nicholes Stairs, MD;  Location: [facility];  Service: Orthopedics;  Laterality: Right;  . PILONIDAL CYST EXCISION N/A 07/02/2014   Procedure: EXCISION OF PILONIDAL CYST WITH PRIMARY CLOSURE;  Surgeon: Jerilynn Mages. Gerald Stabs, MD;  Location: [place];  Service: Pediatrics;  Laterality: N/A;  . PILONIDAL CYST EXCISION N/A 10/22/2014   Procedure: EXCISION PILONIDAL CYST AND SINUS;  Surgeon: Gerald Stabs, MD;  Location: [place];  Service: Pediatrics;  Laterality: N/A;  Re-excision  . WISDOM TOOTH EXTRACTION      There were no vitals filed for this visit.      Subjective Assessment - 01/25/17 1337    Subjective 4.5/10 pain.  Woke up  that way.  See's MD later today.   Currently in Pain? Yes   Pain Score 4    Pain Location Knee                         [facility] Adult PT Treatment/Exercise - 01/25/17 0001      Knee/Hip Exercises: Stretches   Quad Stretch 3 reps;30 seconds   Quad Stretch Limitations prone wih strap and moist heat.  with moist heat   Gastroc Stretch 3 reps;30 seconds   Gastroc Stretch Limitations incline board   Other Knee/Hip Stretches self mobs with rolled towel 5 Reps wit gentle stretches     Knee/Hip Exercises: Aerobic   Stationary Bike L3 5 minutes  .7 miles     Knee/Hip Exercises: Machines for Strengthening   Cybex Leg Press 1 X 15, 25 LBS,  1 X  15, 35 LBS  right only, 5 LBS 15 x 1     Knee/Hip Exercises: Standing   Forward Step Up 2 sets;10 reps;Hand Hold: 2;Step Height: 4"   Other Standing Knee Exercises step on each side of BOSU,  lower back knee 10 X each.  Cued to lower vs move forward.       Knee/Hip Exercises: Seated   Heel Slides 10 reps   Stool Scoot - Round Trips stool scoot  60 feet.    noted lateral patellar pain.  cued for straighter knee.      Knee/Hip Exercises: Supine  Heel Slides 10 reps  with green strap pain free   Other Supine Knee/Hip Exercises self mobs     Knee/Hip Exercises: Prone   Other Prone Exercises quad steetch                  PT Short Term Goals - 01/24/17 1539      PT SHORT TERM GOAL #1   Title pt will be I with inital HEP (12/07/2016)   Baseline independent   Time 4   Period Weeks   Status Achieved     PT SHORT TERM GOAL #2   Title pt will increase R knee PROM / AAROM knee flexion to >/= 80 degrees with </= 6/10 for functional therapuetic progression (12/07/2016)   Time 4   Period Weeks   Status Achieved     PT SHORT TERM GOAL #3   Title pt will be able to elicit strong quad contraction and demo SLR wiht </= -15 extensor lag to demonstrate improvement knee function (12/07/2016)   Baseline quad contraction not  strong.  No quad lag   Time 4   Status Partially Met     PT SHORT TERM GOAL #4   Title pt will be able to don/ doff her brace Independently   Time 4   Period Weeks   Status Achieved           PT Long Term Goals - 01/24/17 1540      PT LONG TERM GOAL #3   Title Able to walk/stand with pain after 30 minutes with 4/10 pain.  After walking/standing 1 hour pain is 5/10.  Stairs continue to be difficult     PT LONG TERM GOAL #5   Title Pt will be able to stand on Rt. LE for min dynamic activity and no LOB, no pain.                Plan - 01/25/17 1623    Clinical Impression Statement 115 AROM,  120 AA rom.  Patient noted difficulty with 4 inch step ups today. Progress toward her  Goals. Mild soreness post session.   PT Treatment/Interventions ADLs/Self Care Home Management;Therapeutic exercise;Ultrasound;Cryotherapy;Electrical Stimulation;Iontophoresis '4mg'$ /ml Dexamethasone;Functional mobility training;Stair training;Neuromuscular re-education;Manual techniques;Patient/family education;Passive range of motion;Vasopneumatic Device;Therapeutic activities;Moist Heat   PT Next Visit Plan  mini step-up,  hip and ankle strength in sitting/ standing, leg press , knee mobs/ patellar mobs, continued stool scoots for knee flexion, See what MD said   PT Home Exercise Plan heel slide using strap (stay within pain free range), quad set, glute set, ankle pumps, standing heel raise, wall squat, heel strike/ toe off with crutch. taping for patella, cross friction massage   Consulted and Agree with Plan of Care Patient      Patient will benefit from skilled therapeutic intervention in order to improve the following deficits and impairments:     Visit Diagnosis: Patellar instability of right knee  Localized edema  Muscle weakness (generalized)  Other abnormalities of gait and mobility  Stiffness of right knee, not elsewhere classified  Difficulty in walking, not elsewhere  classified  Acute pain of right knee     Problem List Patient Active Problem List   Diagnosis Date Noted  . Patellar instability of right knee 10/31/2016  . Infected pilonidal cyst 10/22/2014  . Pilonidal sinus 07/02/2014  . Episodic tension-type headache, not intractable 12/04/2013  . Migraine without aura and without status migrainosus, not intractable 12/04/2013    HARRIS,KAREN PTA 01/25/2017, 4:27 PM  [place], [place], [address] Phone: [phone]   Fax:  [phone]  Name: Suzanne Wolfe MRN: [id number] Date of Birth: 05-15-1996

## 2017-01-29 ENCOUNTER — Encounter (HOSPITAL_COMMUNITY): Payer: Self-pay | Admitting: Emergency Medicine

## 2017-01-29 DIAGNOSIS — Z79899 Other long term (current) drug therapy: Secondary | ICD-10-CM | POA: Diagnosis not present

## 2017-01-29 DIAGNOSIS — R0602 Shortness of breath: Secondary | ICD-10-CM | POA: Diagnosis present

## 2017-01-29 DIAGNOSIS — J45901 Unspecified asthma with (acute) exacerbation: Secondary | ICD-10-CM | POA: Insufficient documentation

## 2017-01-29 MED ORDER — ALBUTEROL SULFATE (2.5 MG/3ML) 0.083% IN NEBU
5.0000 mg | INHALATION_SOLUTION | Freq: Once | RESPIRATORY_TRACT | Status: AC
  Administered 2017-01-29: 5 mg via RESPIRATORY_TRACT

## 2017-01-29 MED ORDER — ALBUTEROL SULFATE (2.5 MG/3ML) 0.083% IN NEBU
INHALATION_SOLUTION | RESPIRATORY_TRACT | Status: AC
  Filled 2017-01-29: qty 6

## 2017-01-29 NOTE — ED Notes (Signed)
Pt reports SOB X2 hrs, has hx of asthma. Pt also feels dizzy, noted to be highly anxious in triage. Encouraged pt to slow her breathing down. Lung sounds noted to be clear, no wheezing.

## 2017-01-30 ENCOUNTER — Emergency Department (HOSPITAL_COMMUNITY)
Admission: EM | Admit: 2017-01-30 | Discharge: 2017-01-30 | Disposition: A | Discharge status: Home / Self Care | Payer: Medicaid Other | Attending: Emergency Medicine | Admitting: Emergency Medicine

## 2017-01-30 DIAGNOSIS — J45901 Unspecified asthma with (acute) exacerbation: Secondary | ICD-10-CM

## 2017-01-30 DIAGNOSIS — J9801 Acute bronchospasm: Secondary | ICD-10-CM

## 2017-01-30 LAB — D-DIMER, QUANTITATIVE (NOT AT ARMC): D DIMER QUANT: 0.28 ug{FEU}/mL (ref 0.00–0.50)

## 2017-01-30 LAB — I-STAT BETA HCG BLOOD, ED (MC, WL, AP ONLY)

## 2017-01-30 MED ORDER — IPRATROPIUM-ALBUTEROL 0.5-2.5 (3) MG/3ML IN SOLN
RESPIRATORY_TRACT | Status: AC
  Filled 2017-01-30: qty 3

## 2017-01-30 MED ORDER — PREDNISONE 10 MG PO TABS: 20.0000 mg | ORAL_TABLET | Freq: Every day | ORAL | 0 refills | Status: DC

## 2017-01-30 MED ORDER — PREDNISONE 20 MG PO TABS
60.0000 mg | ORAL_TABLET | Freq: Once | ORAL | Status: AC
  Administered 2017-01-30: 60 mg via ORAL
  Filled 2017-01-30: qty 3

## 2017-01-30 MED ORDER — IPRATROPIUM-ALBUTEROL 0.5-2.5 (3) MG/3ML IN SOLN
3.0000 mL | Freq: Once | RESPIRATORY_TRACT | Status: AC
  Administered 2017-01-30: 3 mL via RESPIRATORY_TRACT

## 2017-01-30 NOTE — ED Provider Notes (Signed)
[facility]-EMERGENCY DEPT Provider Note   CSN: [id number] Arrival date & time: 01/29/17  2158   History   Chief Complaint Chief Complaint  Patient presents with  . Asthma  . Dizziness    HPI Suzanne Wolfe is a 21 y.o. female.  HPI  Suzanne Wolfe comes to the ER with complaints of an asthma attack.She has a hx of asthma and uses a albuterol pump at home but it was not working. On arrival to the ER she was given a breathing treatment and steroids and this helped, then 1.5 hours later she developed another asthma exacerbation. She was given a second breathing treatment and has done well since then. Her last breathing nebulizer treatment was greater then two hours ago she says. She denies feeling any anxiety, denies having allergies, denies knowing of any triggers. She now feels back to baseline. She has not had any cough, fevers, or lower extremity swelling.  Past Medical History:  Diagnosis Date  . History of Clostridium difficile infection 02/ 2018  resolve w/ antibiotic  . Mild asthma   . Patellar instability of right knee     Patient Active Problem List   Diagnosis Date Noted  . Patellar instability of right knee 10/31/2016  . Infected pilonidal cyst 10/22/2014  . Pilonidal sinus 07/02/2014  . Episodic tension-type headache, not intractable 12/04/2013  . Migraine without aura and without status migrainosus, not intractable 12/04/2013    Past Surgical History:  Procedure Laterality Date  . COLONOSCOPY  08-04-2016  dr stark  . ESOPHAGOGASTRODUODENOSCOPY  06/20/2016  . LAPAROSCOPIC APPENDECTOMY  07/16/2009  . MEDIAL PATELLOFEMORAL LIGAMENT REPAIR Right 10/31/2016   Procedure: Right knee arthroscopic assisted medial patella femoral ligament reconstruction;  Surgeon: Yolonda Kida, MD;  Location: [facility];  Service: Orthopedics;  Laterality: Right;  . PILONIDAL CYST EXCISION N/A 07/02/2014   Procedure: EXCISION OF PILONIDAL CYST WITH PRIMARY CLOSURE;  Surgeon: Judie Petit.  Leonia Corona, MD;  Location: [facility];  Service: Pediatrics;  Laterality: N/A;  . PILONIDAL CYST EXCISION N/A 10/22/2014   Procedure: EXCISION PILONIDAL CYST AND SINUS;  Surgeon: Leonia Corona, MD;  Location: [facility];  Service: Pediatrics;  Laterality: N/A;  Re-excision  . WISDOM TOOTH EXTRACTION      OB History    No data available       Home Medications    Prior to Admission medications   Medication Sig Start Date End Date Taking? Authorizing Provider  acetaminophen (TYLENOL) 500 MG tablet Take 500 mg by mouth every 6 (six) hours as needed.    [provider]  ALBUTEROL IN Inhale into the lungs.    [provider]  ibuprofen (ADVIL,MOTRIN) 200 MG tablet Take 200 mg by mouth every 6 (six) hours as needed.    [provider]  norgestimate-ethinyl estradiol (ESTARYLLA) 0.25-35 MG-MCG tablet Take 1 tablet by mouth every evening.    [provider]  ondansetron (ZOFRAN ODT) 4 MG disintegrating tablet Take 1 tablet (4 mg total) by mouth every 8 (eight) hours as needed for nausea or vomiting. 10/31/16   Yolonda Kida, MD  predniSONE (DELTASONE) 10 MG tablet Take 2 tablets (20 mg total) by mouth daily. 01/30/17   Marlon Pel, PA-C    Family History Family History  Problem Relation Age of Onset  . Diabetes Maternal Grandmother   . Hypertension Maternal Grandmother   . Heart disease Maternal Grandmother        hx. open heart surgery  . Asthma  Father   . Colon cancer Neg Hx   . Esophageal cancer Neg Hx     Social History Social History  Substance Use Topics  . Smoking status: Never Smoker  . Smokeless tobacco: Never Used  . Alcohol use No     Allergies   Patient has no known allergies.   Review of Systems Review of Systems  The patient denies anorexia, fever, weight loss,, vision loss, decreased hearing, hoarseness, chest pain, syncope,  peripheral edema, balance deficits, hemoptysis,  abdominal pain, melena, hematochezia, severe indigestion/heartburn, hematuria, incontinence, genital sores, muscle weakness, suspicious skin lesions, transient blindness, difficulty walking, depression, unusual weight change, abnormal bleeding, enlarged lymph nodes, angioedema, and breast masses.  Physical Exam Updated Vital Signs BP (!) 116/59 (BP Location: Left Arm)   Pulse (!) 113   Temp 98.7 F (37.1 C) (Oral)   Resp 17   Ht  (1.549 m)   Wt 72.6 kg (160 lb)   LMP 01/21/2017 (Approximate)   SpO2 100%   BMI 30.23 kg/m   Physical Exam  Constitutional: She appears well-developed and well-nourished.  HENT:  Head: Normocephalic and atraumatic.  Eyes: Pupils are equal, round, and reactive to light. Conjunctivae are normal.  Neck: Trachea normal, normal range of motion and full passive range of motion without pain. Neck supple.  Cardiovascular: Normal rate, regular rhythm and normal pulses.   Pulmonary/Chest: Effort normal and breath sounds normal. Chest wall is not dull to percussion. She exhibits no tenderness, no crepitus, no edema, no deformity and no retraction.  Abdominal: Soft. Normal appearance and bowel sounds are normal.  Musculoskeletal: Normal range of motion.  Neurological: She is alert. She has normal strength.  Skin: Skin is warm, dry and intact.  Psychiatric: She has a normal mood and affect. Her speech is normal and behavior is normal. Judgment and thought content normal. Cognition and memory are normal.     ED Treatments / Results  Labs (all labs ordered are listed, but only abnormal results are displayed) Labs Reviewed  D-DIMER, QUANTITATIVE (NOT AT [facility])  I-STAT BETA HCG BLOOD, ED ([facility], [facility], [facility] ONLY)    EKG  EKG Interpretation None       Radiology No results found.  Procedures Procedures (including critical care time)  Medications Ordered in ED Medications  albuterol (PROVENTIL) (2.5 MG/3ML) 0.083% nebulizer solution 5 mg (5 mg Nebulization  Given 01/29/17 2210)  predniSONE (DELTASONE) tablet 60 mg (60 mg Oral Given 01/30/17 0156)  ipratropium-albuterol (DUONEB) 0.5-2.5 (3) MG/3ML nebulizer solution 3 mL (3 mLs Nebulization Given 01/30/17 0152)     Initial Impression / Assessment and Plan / ED Course  I have reviewed the triage vital signs and the nursing notes.  Pertinent labs & imaging results that were available during my care of the patient were reviewed by me and considered in my medical decision making (see chart for details).  Clinical Course as of Jan 31 836  Tue Jan 30, 2017  0710 Patient evaluated and is no longer having any symptoms of shortness of breath or chest tightness.   [TG]  [id number] Her lungs are clear to me and were clear on her triage exam. She does take birth control and denies having anxiety. Will order b-hcg and d-dimer. If these tests are normal I consider she may have been experiencing bronchospasms or anxiety.  [TG]  [id number] D-dimer and b-hcg is negative.  [TG]    Clinical Course User Index [TG] Marlon Pel, PA-C     Rx: Prednisone  dosepack  Blood pressure (!) 116/59, pulse (!) 113, temperature 98.7 F (37.1 C), temperature source Oral, resp. rate 17, height  (1.549 m), weight 72.6 kg (160 lb), last menstrual period 01/21/2017, SpO2 100 %.  Jenascia Bumpass has been evaluated today in the emergency department. The appropriate screening and testing was been performed and I believe the patient to be medically stable for discharge.   Return signs and symptoms have been discussed with the patient and/or caregivers and they have voiced their understanding. The patient has agreed to follow-up with their primary care provider or the referred specialist.     Final Clinical Impressions(s) / ED Diagnoses   Final diagnoses:  Mild asthma with exacerbation, unspecified whether persistent  Bronchospasm    New Prescriptions New Prescriptions   PREDNISONE (DELTASONE) 10 MG TABLET    Take 2 tablets (20  mg total) by mouth daily.     Marlon Pel, PA-C 01/30/17 1610    Gerhard Munch, MD 01/30/17 [phone]

## 2017-01-31 ENCOUNTER — Encounter: Payer: Self-pay | Admitting: Physical Therapy

## 2017-01-31 ENCOUNTER — Ambulatory Visit: Payer: Medicaid Other | Admitting: Physical Therapy

## 2017-01-31 DIAGNOSIS — R262 Difficulty in walking, not elsewhere classified: Secondary | ICD-10-CM

## 2017-01-31 DIAGNOSIS — M25361 Other instability, right knee: Secondary | ICD-10-CM | POA: Diagnosis not present

## 2017-01-31 DIAGNOSIS — R2689 Other abnormalities of gait and mobility: Secondary | ICD-10-CM

## 2017-01-31 DIAGNOSIS — M6281 Muscle weakness (generalized): Secondary | ICD-10-CM

## 2017-01-31 DIAGNOSIS — R600 Localized edema: Secondary | ICD-10-CM

## 2017-01-31 DIAGNOSIS — M25661 Stiffness of right knee, not elsewhere classified: Secondary | ICD-10-CM

## 2017-01-31 NOTE — Patient Instructions (Signed)

## 2017-01-31 NOTE — Therapy (Signed)
[place], [place], [address] Phone: [phone]   Fax:  [phone]  Physical Therapy Treatment  Patient Details  Name: Suzanne Wolfe MRN: [id number] Date of Birth: 1996/04/13 Referring Provider: Dannielle Karvonen rogers MD  Encounter Date: 01/31/2017      PT End of Session - 01/31/17 1627    Visit Number 19   Number of Visits 24   Date for PT Re-Evaluation 02/14/17   PT Start Time 5462   PT Stop Time 1630   PT Time Calculation (min) 45 min   Activity Tolerance Patient tolerated treatment well   Behavior During Therapy [facility] for tasks assessed/performed      Past Medical History:  Diagnosis Date  . History of Clostridium difficile infection 02/ 2018  resolve w/ antibiotic  . Mild asthma   . Patellar instability of right knee     Past Surgical History:  Procedure Laterality Date  . COLONOSCOPY  08-04-2016  dr stark  . ESOPHAGOGASTRODUODENOSCOPY  06/20/2016  . LAPAROSCOPIC APPENDECTOMY  07/16/2009  . MEDIAL PATELLOFEMORAL LIGAMENT REPAIR Right 10/31/2016   Procedure: Right knee arthroscopic assisted medial patella femoral ligament reconstruction;  Surgeon: Nicholes Stairs, MD;  Location: [facility];  Service: Orthopedics;  Laterality: Right;  . PILONIDAL CYST EXCISION N/A 07/02/2014   Procedure: EXCISION OF PILONIDAL CYST WITH PRIMARY CLOSURE;  Surgeon: Jerilynn Mages. Gerald Stabs, MD;  Location: [place];  Service: Pediatrics;  Laterality: N/A;  . PILONIDAL CYST EXCISION N/A 10/22/2014   Procedure: EXCISION PILONIDAL CYST AND SINUS;  Surgeon: Gerald Stabs, MD;  Location: [place];  Service: Pediatrics;  Laterality: N/A;  Re-excision  . WISDOM TOOTH EXTRACTION      There were no vitals filed for this visit.      Subjective Assessment - 01/31/17 1547    Subjective "I went to the ER with an asthma exacerbation, I went to see my Md and he stated I have  tendinitis"   Currently in Pain? Yes   Pain Score 5    Pain Location Knee   Pain Orientation Left                         [facility] Adult PT Treatment/Exercise - 01/31/17 1609      Knee/Hip Exercises: Stretches   Active Hamstring Stretch 30 seconds;4 reps;Other (comment)  2 x in sitting 2 x supine     Knee/Hip Exercises: Seated   Long Arc Quad 20 reps;Weights  with controlled eccentrics    Long Arc Quad Weight 2 lbs.     Knee/Hip Exercises: Supine   Short Arc Quad Sets Strengthening;2 sets;10 reps  2#     Modalities   Modalities Iontophoresis     Iontophoresis   Type of Iontophoresis Dexamethasone   Location r medial patellar tendon   Dose 52m/ml   Time 6 hour stat patch     Manual Therapy   Manual therapy comments manual trigger point release over the rectus femoris   Soft tissue mobilization IASTM over patellar tendon   Other Manual Therapy applied pre-wrap cho-pat strap over distal patellar tendon  education how to apply cho-pat and to avoid improper placeme                PT Education - 01/31/17 1737    Education provided Yes   Education Details benefits of cho-pat strap and where to place the strap to prevent additional pain. Iontophoresis  and benefits and length of wear.    Person(s) Educated Patient   Methods Explanation;Verbal cues;Handout   Comprehension Verbalized understanding;Verbal cues required          PT Short Term Goals - 01/24/17 1539      PT SHORT TERM GOAL #1   Title pt will be I with inital HEP (12/07/2016)   Baseline independent   Time 4   Period Weeks   Status Achieved     PT SHORT TERM GOAL #2   Title pt will increase R knee PROM / AAROM knee flexion to >/= 80 degrees with </= 6/10 for functional therapuetic progression (12/07/2016)   Time 4   Period Weeks   Status Achieved     PT SHORT TERM GOAL #3   Title pt will be able to elicit strong quad contraction and demo SLR wiht </= -15 extensor lag to demonstrate  improvement knee function (12/07/2016)   Baseline quad contraction not strong.  No quad lag   Time 4   Status Partially Met     PT SHORT TERM GOAL #4   Title pt will be able to don/ doff her brace Independently   Time 4   Period Weeks   Status Achieved           PT Long Term Goals - 01/24/17 1540      PT LONG TERM GOAL #3   Title Able to walk/stand with pain after 30 minutes with 4/10 pain.  After walking/standing 1 hour pain is 5/10.  Stairs continue to be difficult     PT LONG TERM GOAL #5   Title Pt will be able to stand on Rt. LE for min dynamic activity and no LOB, no pain.                Plan - 01/31/17 1731    Clinical Impression Statement due to recent dx of tendinitis, focused on manual techniques over the patellar tendon and applied cho-pat educated where to place the strap to prevent additional pain. focused on eccentric quad control to help with remodeling for the patellar tendon.    PT Next Visit Plan  mini step-up,  hip and ankle strength in sitting/ standing, leg press , knee mobs/ patellar mobs, continued stool scoots for knee flexion, how was Cho-pat strap   Consulted and Agree with Plan of Care Patient      Patient will benefit from skilled therapeutic intervention in order to improve the following deficits and impairments:  Decreased range of motion, Increased fascial restricitons, Pain, Impaired flexibility, Increased edema, Decreased strength, Decreased mobility, Decreased endurance, Decreased activity tolerance  Visit Diagnosis: Patellar instability of right knee  Localized edema  Muscle weakness (generalized)  Other abnormalities of gait and mobility  Stiffness of right knee, not elsewhere classified  Difficulty in walking, not elsewhere classified     Problem List Patient Active Problem List   Diagnosis Date Noted  . Patellar instability of right knee 10/31/2016  . Infected pilonidal cyst 10/22/2014  . Pilonidal sinus 07/02/2014   . Episodic tension-type headache, not intractable 12/04/2013  . Migraine without aura and without status migrainosus, not intractable 12/04/2013   Starr Lake PT, DPT, LAT, ATC  01/31/17  5:38 PM      [facility] [address] [place], [place], [address] Phone: [phone]   Fax:  [phone]  Name: Suzanne Wolfe MRN: [id number] Date of Birth: 12-05-1995

## 2017-02-06 ENCOUNTER — Ambulatory Visit: Payer: Medicaid Other | Admitting: Physical Therapy

## 2017-02-06 ENCOUNTER — Encounter: Payer: Self-pay | Admitting: Physical Therapy

## 2017-02-06 DIAGNOSIS — R262 Difficulty in walking, not elsewhere classified: Secondary | ICD-10-CM

## 2017-02-06 DIAGNOSIS — M25561 Pain in right knee: Secondary | ICD-10-CM

## 2017-02-06 DIAGNOSIS — M25361 Other instability, right knee: Secondary | ICD-10-CM

## 2017-02-06 DIAGNOSIS — M6281 Muscle weakness (generalized): Secondary | ICD-10-CM

## 2017-02-06 DIAGNOSIS — M25661 Stiffness of right knee, not elsewhere classified: Secondary | ICD-10-CM

## 2017-02-06 DIAGNOSIS — R600 Localized edema: Secondary | ICD-10-CM

## 2017-02-06 DIAGNOSIS — R2689 Other abnormalities of gait and mobility: Secondary | ICD-10-CM

## 2017-02-06 NOTE — Therapy (Signed)
[place], [place], [address] Phone: [phone]   Fax:  [phone]  Physical Therapy Treatment  Patient Details  Name: Suzanne Wolfe MRN: [id number] Date of Birth: 07/24/1995 Referring Provider: Dannielle Karvonen rogers MD  Encounter Date: 02/06/2017      PT End of Session - 02/06/17 1619    Visit Number 20   Number of Visits 24   Date for PT Re-Evaluation 02/14/17   PT Start Time 1505   PT Stop Time 1545   PT Time Calculation (min) 40 min   Activity Tolerance Patient tolerated treatment well   Behavior During Therapy [facility] for tasks assessed/performed      Past Medical History:  Diagnosis Date  . History of Clostridium difficile infection 02/ 2018  resolve w/ antibiotic  . Mild asthma   . Patellar instability of right knee     Past Surgical History:  Procedure Laterality Date  . COLONOSCOPY  08-04-2016  dr stark  . ESOPHAGOGASTRODUODENOSCOPY  06/20/2016  . LAPAROSCOPIC APPENDECTOMY  07/16/2009  . MEDIAL PATELLOFEMORAL LIGAMENT REPAIR Right 10/31/2016   Procedure: Right knee arthroscopic assisted medial patella femoral ligament reconstruction;  Surgeon: Nicholes Stairs, MD;  Location: [facility];  Service: Orthopedics;  Laterality: Right;  . PILONIDAL CYST EXCISION N/A 07/02/2014   Procedure: EXCISION OF PILONIDAL CYST WITH PRIMARY CLOSURE;  Surgeon: Jerilynn Mages. Gerald Stabs, MD;  Location: [place];  Service: Pediatrics;  Laterality: N/A;  . PILONIDAL CYST EXCISION N/A 10/22/2014   Procedure: EXCISION PILONIDAL CYST AND SINUS;  Surgeon: Gerald Stabs, MD;  Location: [place];  Service: Pediatrics;  Laterality: N/A;  Re-excision  . WISDOM TOOTH EXTRACTION      There were no vitals filed for this visit.      Subjective Assessment - 02/06/17 1511    Subjective The patch and strap help.  i took the strap off 1 hour ago.  Less pain with stretching quads.    Pain Location Knee   Pain Orientation Right   Pain Descriptors / Indicators Aching   Pain Type Acute pain   Pain Frequency Intermittent   Aggravating Factors  longer walking without.   walk   Pain Relieving Factors ibuprophen , but not every day   Effect of Pain on Daily Activities Walking limited to about an hour.  Not yet back to work or school.   Multiple Pain Sites --  Back mild, lateral hip pain with end range swing phase intermittant,  walking worse not consistant                         [facility] Adult PT Treatment/Exercise - 02/06/17 0001      Self-Care   Self-Care RICE   RICE knee pain     Knee/Hip Exercises: Stretches   Passive Hamstring Stretch 3 reps;30 seconds   Passive Hamstring Stretch Limitations decreased ROM  505 LIMITED   Gastroc Stretch 3 reps;30 seconds   Gastroc Stretch Limitations incline board     Knee/Hip Exercises: Aerobic   Stationary Bike L1 8 minutes     Knee/Hip Exercises: Standing   Forward Step Up Limitations eccentric focus, 4 inches 2 hands,  pain with lowering,  less pain with hip position change but still pain so stopped.     Knee/Hip Exercises: Seated   Long Arc Quad 15 reps   Long Arc Quad Weight 3 lbs.   Long [employer] Limitations eccentric  focus.  Increased pain at around 15th rep so stopped   Stool Scoot - Round Trips 62 feet no rests,   round stool   Ball Squeeze 10 x 2 sets,  pink ball,    Marching Limitations 20   Marching Weights 3 lbs.     Knee/Hip Exercises: Supine   Quad Sets 10 reps   Short Arc Quad Sets Strengthening;2 sets;10 reps  0#   Short Arc Quad Sets Limitations with pink ball squeeze   Straight Leg Raises 1 set;10 reps   Straight Leg Raises Limitations cued for slow lowering.,  lower lifts.   Patellar Mobs yes     Moist Heat Therapy   Moist Heat Location --  hamstrings and patella,  concurrent with stretching,  mobilt     Cryotherapy   Number Minutes Cryotherapy 4 Minutes   Cryotherapy Location  Knee   Type of Cryotherapy Ice massage  patellar tendon,  less pain post cold     Iontophoresis   Type of Iontophoresis Dexamethasone   Location right medial patella   Dose 73m/ml   Time 6 hour stat patch                PT Education - 02/06/17 1619    Education provided Yes   Education Details benifits of cold therapy   Person(s) Educated Patient   Methods Explanation   Comprehension Verbalized understanding          PT Short Term Goals - 01/24/17 1539      PT SHORT TERM GOAL #1   Title pt will be I with inital HEP (12/07/2016)   Baseline independent   Time 4   Period Weeks   Status Achieved     PT SHORT TERM GOAL #2   Title pt will increase R knee PROM / AAROM knee flexion to >/= 80 degrees with </= 6/10 for functional therapuetic progression (12/07/2016)   Time 4   Period Weeks   Status Achieved     PT SHORT TERM GOAL #3   Title pt will be able to elicit strong quad contraction and demo SLR wiht </= -15 extensor lag to demonstrate improvement knee function (12/07/2016)   Baseline quad contraction not strong.  No quad lag   Time 4   Status Partially Met     PT SHORT TERM GOAL #4   Title pt will be able to don/ doff her brace Independently   Time 4   Period Weeks   Status Achieved           PT Long Term Goals - 01/24/17 1540      PT LONG TERM GOAL #3   Title Able to walk/stand with pain after 30 minutes with 4/10 pain.  After walking/standing 1 hour pain is 5/10.  Stairs continue to be difficult     PT LONG TERM GOAL #5   Title Pt will be able to stand on Rt. LE for min dynamic activity and no LOB, no pain.                Plan - 02/06/17 1630    Clinical Impression Statement Continue to work on tendinitis.  Iontophoresis was irritating to patient when she got to the front desk,  so she removed it.  Eccentric quads OK until she increases reps.  No new goals.  Eccentric focus today.   PT Next Visit Plan  mini step-up,  hip and ankle  strength in sitting/ standing, leg press , knee  mobs/ patellar mobs, continued stool scoots for knee flexion, how was Cho-pat strap   PT Home Exercise Plan heel slide using strap (stay within pain free range), quad set, glute set, ankle pumps, standing heel raise, wall squat, heel strike/ toe off with crutch. taping for patella, cross friction massage   Consulted and Agree with Plan of Care Patient      Patient will benefit from skilled therapeutic intervention in order to improve the following deficits and impairments:     Visit Diagnosis: Patellar instability of right knee  Localized edema  Muscle weakness (generalized)  Other abnormalities of gait and mobility  Stiffness of right knee, not elsewhere classified  Difficulty in walking, not elsewhere classified  Acute pain of right knee     Problem List Patient Active Problem List   Diagnosis Date Noted  . Patellar instability of right knee 10/31/2016  . Infected pilonidal cyst 10/22/2014  . Pilonidal sinus 07/02/2014  . Episodic tension-type headache, not intractable 12/04/2013  . Migraine without aura and without status migrainosus, not intractable 12/04/2013    HARRIS,KAREN PTA 02/06/2017, 5:57 PM  [facility] [address] [place], [place], [address] Phone: [phone]   Fax:  [phone]  Name: Suzanne Wolfe MRN: [id number] Date of Birth: [birth year]

## 2017-02-08 ENCOUNTER — Ambulatory Visit: Payer: Medicaid Other | Admitting: Physical Therapy

## 2017-02-08 ENCOUNTER — Encounter: Payer: Self-pay | Admitting: Physical Therapy

## 2017-02-08 DIAGNOSIS — R262 Difficulty in walking, not elsewhere classified: Secondary | ICD-10-CM

## 2017-02-08 DIAGNOSIS — M6281 Muscle weakness (generalized): Secondary | ICD-10-CM

## 2017-02-08 DIAGNOSIS — M25561 Pain in right knee: Secondary | ICD-10-CM

## 2017-02-08 DIAGNOSIS — R600 Localized edema: Secondary | ICD-10-CM

## 2017-02-08 DIAGNOSIS — M25661 Stiffness of right knee, not elsewhere classified: Secondary | ICD-10-CM

## 2017-02-08 DIAGNOSIS — M25361 Other instability, right knee: Secondary | ICD-10-CM | POA: Diagnosis not present

## 2017-02-08 DIAGNOSIS — R2689 Other abnormalities of gait and mobility: Secondary | ICD-10-CM

## 2017-02-08 NOTE — Patient Instructions (Signed)
Heat/  [place] Quad stretch 3 x 30 Inferior patellar glides.  10-20 X Daily

## 2017-02-08 NOTE — Therapy (Signed)
[place], [place], [address] Phone: [phone]   Fax:  [phone]  Physical Therapy Treatment  Patient Details  Name: Suzanne Wolfe MRN: [id number] Date of Birth: January 30, 1996 Referring Provider: Dannielle Karvonen rogers MD  Encounter Date: 02/08/2017      PT End of Session - 02/08/17 1651    Visit Number 21   Number of Visits 24   Date for PT Re-Evaluation 02/14/17   PT Start Time 1550   PT Stop Time 1633   PT Time Calculation (min) 43 min   Activity Tolerance Patient tolerated treatment well   Behavior During Therapy [facility] for tasks assessed/performed      Past Medical History:  Diagnosis Date  . History of Clostridium difficile infection 02/ 2018  resolve w/ antibiotic  . Mild asthma   . Patellar instability of right knee     Past Surgical History:  Procedure Laterality Date  . COLONOSCOPY  08-04-2016  dr stark  . ESOPHAGOGASTRODUODENOSCOPY  06/20/2016  . LAPAROSCOPIC APPENDECTOMY  07/16/2009  . MEDIAL PATELLOFEMORAL LIGAMENT REPAIR Right 10/31/2016   Procedure: Right knee arthroscopic assisted medial patella femoral ligament reconstruction;  Surgeon: Nicholes Stairs, MD;  Location: [facility];  Service: Orthopedics;  Laterality: Right;  . PILONIDAL CYST EXCISION N/A 07/02/2014   Procedure: EXCISION OF PILONIDAL CYST WITH PRIMARY CLOSURE;  Surgeon: Jerilynn Mages. Gerald Stabs, MD;  Location: [place];  Service: Pediatrics;  Laterality: N/A;  . PILONIDAL CYST EXCISION N/A 10/22/2014   Procedure: EXCISION PILONIDAL CYST AND SINUS;  Surgeon: Gerald Stabs, MD;  Location: [place];  Service: Pediatrics;  Laterality: N/A;  Re-excision  . WISDOM TOOTH EXTRACTION      There were no vitals filed for this visit.      Subjective Assessment - 02/08/17 1553    Subjective 4/10.  It usually hurts like this.  (  For a couple of days)   Not sure why.   Currently in Pain?  Yes   Pain Score 4    Pain Location Knee   Pain Orientation Right   Pain Descriptors / Indicators Aching   Pain Type Acute pain   Pain Frequency Constant   Aggravating Factors  walking   Pain Relieving Factors ibuprophen but not every day,   change of position,    stretching.   Effect of Pain on Daily Activities limits walking,  Wakes if she turns leg too much                         [facility] Adult PT Treatment/Exercise - 02/08/17 0001      High Level Balance   High Level Balance Comments on small tramp:  mini squats,  cued for tecxhnique for less pain.  Rhomberg and tandem tasks static and dynamic.  Dynamic tasks challanging and single limb tasks challanging.  CGA to SBA  for safety.     Knee/Hip Exercises: Stretches   Passive Hamstring Stretch 3 reps;30 seconds   Passive Hamstring Stretch Limitations seated with strap   Quad Stretch 3 reps;30 seconds   Quad Stretch Limitations with moist heat and manual inferior glides. to patella   Gastroc Stretch 3 reps;30 seconds   Gastroc Stretch Limitations incline board     Knee/Hip Exercises: Aerobic   Stationary Bike L2 X 6 minutes     Knee/Hip Exercises: Standing   Forward Step Up Limitations able to step up on 6-7 inch  trampoline no complaint   SLS with right,  swing left   Gait Training Trial of "YOGA  "  walking to see if decreased pain noted.  NO change.      Knee/Hip Exercises: Supine   Quad Sets 10 reps   Quad Sets Limitations 5 X 2 sets.  contraction improved after quad stretch and iferior glides of patella.    Short Arc [employer] 10 reps   Short Arc Quad Sets Limitations 10 second hold 10 X   Straight Leg Raises 10 reps   Straight Leg Raises Limitations slow lowering     Moist Heat Therapy   Number Minutes Moist Heat 15 Minutes   Moist Heat Location --  knee hamstrings,  quads,  concurrent with manual.       Iontophoresis   Location Declined     Manual Therapy   Soft tissue mobilization manual over  patellar tendon, soft tissue work just distal to medial scar to soften.                PT Education - 02/08/17 1651    Education provided Yes   Education Details HEP   Person(s) Educated Patient   Methods Explanation;Demonstration;Verbal cues   Comprehension Verbalized understanding          PT Short Term Goals - 02/08/17 1654      PT SHORT TERM GOAL #1   Title pt will be I with inital HEP (12/07/2016)   Baseline independent   Time 4   Period Weeks   Status Achieved     PT SHORT TERM GOAL #2   Title pt will increase R knee PROM / AAROM knee flexion to >/= 80 degrees with </= 6/10 for functional therapuetic progression (12/07/2016)   Time 4   Period Weeks   Status Achieved     PT SHORT TERM GOAL #3   Title pt will be able to elicit strong quad contraction and demo SLR wiht </= -15 extensor lag to demonstrate improvement knee function (12/07/2016)   Baseline Fair > good quad contraction.  Good quad contraction quickly fatigues.  quad contraction improving   Time 4   Period Weeks   Status Partially Met     PT SHORT TERM GOAL #4   Title pt will be able to don/ doff her brace Independently   Time 4   Period Weeks   Status Achieved           PT Long Term Goals - 02/08/17 1656      PT LONG TERM GOAL #1   Title pt will increase knee flexion to >/= 120 degrees and </= -2 degrees with </= 2/10 pain for function and efficient gait pattern (01/02/2017)   Baseline - 8 degrees extension prior to session   Time 8   Period Weeks   Status On-going     PT LONG TERM GOAL #2   Title improve R knee/ hip strength to >/= 4/5 to provide hip/ knee stability with walking/ standing (01/02/2017)   Time 8   Period Weeks   Status Unable to assess     PT LONG TERM GOAL #3   Title Able to walk/stand with pain after 30 minutes with 4/10 pain.  After walking/standing 1 hour pain is 5/10.  Stairs continue to be difficult   Baseline 4/10 pain with walking today   Time 8   Period  Weeks   Status On-going     PT LONG TERM GOAL #4  Title pt will be I with all HEP given as of last visit (01/02/2017)   Baseline continued with current HEP   Time 8   Period Weeks   Status On-going     PT LONG TERM GOAL #5   Title Pt will be able to stand on Rt. LE for min dynamic activity and no LOB, no pain.    Baseline SLS difficult on compliant surface   Time 8   Period Weeks   Status On-going               Plan - 02/08/17 1652    Clinical Impression Statement Patient declined Iontophoresis  for tendinitis.  Pain "Less than 4/10 " at end of session.  It feel better.  Quad contraction improved post quad stretch and inferior glides to patella.  Quads continue to quickly fatigue.     PT Next Visit Plan  mini step-up,  hip and ankle strength in sitting/ standing, leg press , knee mobs/ patellar mobs, continued stool scoots for knee flexion, how was Cho-pat strap   PT Home Exercise Plan heel slide using strap (stay within pain free range), quad set, glute set, ankle pumps, standing heel raise, wall squat, heel strike/ toe off with crutch. taping for patella, cross friction massage   Consulted and Agree with Plan of Care Patient      Patient will benefit from skilled therapeutic intervention in order to improve the following deficits and impairments:     Visit Diagnosis: Patellar instability of right knee  Localized edema  Muscle weakness (generalized)  Other abnormalities of gait and mobility  Stiffness of right knee, not elsewhere classified  Difficulty in walking, not elsewhere classified  Acute pain of right knee     Problem List Patient Active Problem List   Diagnosis Date Noted  . Patellar instability of right knee 10/31/2016  . Infected pilonidal cyst 10/22/2014  . Pilonidal sinus 07/02/2014  . Episodic tension-type headache, not intractable 12/04/2013  . Migraine without aura and without status migrainosus, not intractable 12/04/2013     Yonathan Perrow PTA 02/08/2017, 5:00 PM  [facility] [address] [place], [place], [address] Phone: [phone]   Fax:  [phone]  Name: Suzanne Wolfe MRN: [id number] Date of Birth: 01-23-1996

## 2017-02-13 ENCOUNTER — Ambulatory Visit: Payer: Medicaid Other | Attending: Orthopedic Surgery | Admitting: Physical Therapy

## 2017-02-13 ENCOUNTER — Encounter: Payer: Self-pay | Admitting: Physical Therapy

## 2017-02-13 DIAGNOSIS — M25661 Stiffness of right knee, not elsewhere classified: Secondary | ICD-10-CM

## 2017-02-13 DIAGNOSIS — R2689 Other abnormalities of gait and mobility: Secondary | ICD-10-CM | POA: Insufficient documentation

## 2017-02-13 DIAGNOSIS — R262 Difficulty in walking, not elsewhere classified: Secondary | ICD-10-CM | POA: Diagnosis present

## 2017-02-13 DIAGNOSIS — M25561 Pain in right knee: Secondary | ICD-10-CM | POA: Diagnosis present

## 2017-02-13 DIAGNOSIS — R600 Localized edema: Secondary | ICD-10-CM | POA: Insufficient documentation

## 2017-02-13 DIAGNOSIS — M6281 Muscle weakness (generalized): Secondary | ICD-10-CM | POA: Diagnosis present

## 2017-02-13 DIAGNOSIS — M25361 Other instability, right knee: Secondary | ICD-10-CM | POA: Diagnosis not present

## 2017-02-13 NOTE — Therapy (Signed)
[place], [place], [address] Phone: [phone]   Fax:  [phone]  Physical Therapy Treatment / Re-certification  Patient Details  Name: Suzanne Wolfe MRN: [id number] Date of Birth: Sep 02, 1995 Referring Provider: Dannielle Karvonen rogers MD  Encounter Date: 02/13/2017      PT End of Session - 02/13/17 1611    Visit Number 22   Number of Visits 25   Date for PT Re-Evaluation 03/06/17   PT Start Time 1740   PT Stop Time 1631   PT Time Calculation (min) 45 min   Activity Tolerance Patient tolerated treatment well   Behavior During Therapy [facility] for tasks assessed/performed      Past Medical History:  Diagnosis Date  . History of Clostridium difficile infection 02/ 2018  resolve w/ antibiotic  . Mild asthma   . Patellar instability of right knee     Past Surgical History:  Procedure Laterality Date  . COLONOSCOPY  08-04-2016  dr stark  . ESOPHAGOGASTRODUODENOSCOPY  06/20/2016  . LAPAROSCOPIC APPENDECTOMY  07/16/2009  . MEDIAL PATELLOFEMORAL LIGAMENT REPAIR Right 10/31/2016   Procedure: Right knee arthroscopic assisted medial patella femoral ligament reconstruction;  Surgeon: Nicholes Stairs, MD;  Location: [facility];  Service: Orthopedics;  Laterality: Right;  . PILONIDAL CYST EXCISION N/A 07/02/2014   Procedure: EXCISION OF PILONIDAL CYST WITH PRIMARY CLOSURE;  Surgeon: Jerilynn Mages. Gerald Stabs, MD;  Location: [place];  Service: Pediatrics;  Laterality: N/A;  . PILONIDAL CYST EXCISION N/A 10/22/2014   Procedure: EXCISION PILONIDAL CYST AND SINUS;  Surgeon: Gerald Stabs, MD;  Location: [place];  Service: Pediatrics;  Laterality: N/A;  Re-excision  . WISDOM TOOTH EXTRACTION      There were no vitals filed for this visit.      Subjective Assessment - 02/13/17 1551    Subjective "I have noticed I have been sore for the last few days, pain at  3/10. pt  reports falling and she tripped and fell onto both knees>   Currently in Pain? Yes   Pain Score 3    Pain Location Knee   Pain Orientation Right   Pain Descriptors / Indicators Aching   Pain Type Acute pain;Chronic pain   Pain Onset More than a month ago            [facility] PT Assessment - 02/13/17 1603      AROM   Right Knee Extension -5   Right Knee Flexion 121     Strength   Right Hip Extension 4-/5   Right Hip ABduction 4-/5   Right Knee Flexion 4+/5   Right Knee Extension 4+/5  soreness noted with testing                     [facility] Adult PT Treatment/Exercise - 02/13/17 1553      Knee/Hip Exercises: Stretches   Passive Hamstring Stretch 2 reps;30 seconds   Quad Stretch 2 reps;30 seconds   Gastroc Stretch 2 reps;30 seconds   Gastroc Stretch Limitations incline board     Knee/Hip Exercises: Aerobic   Stationary Bike L3 X 6 minutes     Knee/Hip Exercises: Machines for Strengthening   Cybex Knee Extension 2 x 10 20#   Cybex Leg Press 1 x 15 55#, 1 x 15 65#  with ball between the knees     Knee/Hip Exercises: Standing   Forward Step Up 10 reps;4 sets;Step Height: 6"  stepping on  to Bosu x 2 sets, 2 sets on step   Other Standing Knee Exercises lateral band walks 2 x 10 ft with green theraband             Balance Exercises - 02/13/17 1751      Balance Exercises: Standing   Rebounder Single leg;Double leg;20 reps  2 sets double limbg with yellow ball, 2 sets with RLE stance           PT Education - 02/13/17 1740    Education provided Yes   Education Details discussed updated POC, reviewed previously provided HEP and added new HEP   Person(s) Educated Patient   Methods Explanation;Verbal cues;Handout   Comprehension Verbalized understanding;Verbal cues required          PT Short Term Goals - 02/13/17 1741      PT SHORT TERM GOAL #1   Title pt will be I with inital HEP (12/07/2016)   Time 4   Period Weeks   Status Achieved      PT SHORT TERM GOAL #2   Title pt will increase R knee PROM / AAROM knee flexion to >/= 80 degrees with </= 6/10 for functional therapuetic progression (12/07/2016)   Time 4   Period Weeks   Status Achieved     PT SHORT TERM GOAL #3   Title pt will be able to elicit strong quad contraction and demo SLR wiht </= -15 extensor lag to demonstrate improvement knee function (12/07/2016)   Time 4   Period Weeks   Status Partially Met     PT SHORT TERM GOAL #4   Title pt will be able to don/ doff her brace Independently   Time 4   Period Weeks   Status Achieved           PT Long Term Goals - 02/13/17 1742      PT LONG TERM GOAL #1   Title pt will increase knee flexion to >/= 120 degrees and </= -2 degrees with </= 2/10 pain for function and efficient gait pattern (01/02/2017)   Baseline 121 flexion and -5 extension   Time 8   Period Weeks   Status Partially Met     PT LONG TERM GOAL #2   Title improve R knee/ hip strength to >/= 4/5 to provide hip/ knee stability with walking/ standing (01/02/2017)   Time 8   Period Weeks   Status Achieved   Target Date 02/27/17     PT LONG TERM GOAL #3   Title pt will be able to walk / standing for >/= 30 min and navigate up/down >/= 10 stairs reciprocally with </= +1HHA for safety for functional mobility reqired for school and work    Baseline Able to walk/stand with pain after 30 minutes with 4/10 pain.  After walking/standing 1 hour pain is 5/10.  Stairs continue to be difficult   Time 8   Period Weeks   Status Partially Met   Target Date 02/27/17     PT LONG TERM GOAL #4   Title pt will be I with all HEP given as of last visit (01/02/2017)   Baseline continued with current HEP   Time 8   Period Weeks   Status On-going   Target Date 02/27/17     PT LONG TERM GOAL #5   Title Pt will be able to stand on Rt. LE for min dynamic activity and no LOB, no pain.    Time 8   Period  Weeks   Status Partially Met   Target Date 02/27/17                Plan - 02/13/17 1748    Clinical Impression Statement pt continues to make progres with physical therapy increasing knee mobility from -5 to 121 degrees today. She reported falling and 1 day ago and landed on her knees which is likely contributing to her knee pain. Focused on strengthening and balance which she performed well with all with some apprehension with step ups. she is progressing well with treament. plan to conitnue for 1 x a week for the next 2-3 weeks to work on remaining deficits and work toward independent exercise.    Rehab Potential Good   PT Frequency 1x / week   PT Duration 3 weeks   PT Treatment/Interventions ADLs/Self Care Home Management;Therapeutic exercise;Ultrasound;Cryotherapy;Electrical Stimulation;Iontophoresis 76m/ml Dexamethasone;Functional mobility training;Stair training;Neuromuscular re-education;Manual techniques;Patient/family education;Passive range of motion;Vasopneumatic Device;Therapeutic activities;Moist Heat   PT Next Visit Plan step-ups, strengthening of hip/ knee, balance, updated HEP,    PT Home Exercise Plan heel slide using strap (stay within pain free range), quad set, glute set, ankle pumps, standing heel raise, wall squat, heel strike/ toe off with crutch. taping for patella, cross friction massage   Consulted and Agree with Plan of Care Patient      Patient will benefit from skilled therapeutic intervention in order to improve the following deficits and impairments:  Decreased range of motion, Increased fascial restricitons, Pain, Impaired flexibility, Increased edema, Decreased strength, Decreased mobility, Decreased endurance, Decreased activity tolerance  Visit Diagnosis: Patellar instability of right knee - Plan: PT plan of care cert/re-cert  Localized edema - Plan: PT plan of care cert/re-cert  Muscle weakness (generalized) - Plan: PT plan of care cert/re-cert  Other abnormalities of gait and mobility - Plan: PT plan of  care cert/re-cert  Stiffness of right knee, not elsewhere classified - Plan: PT plan of care cert/re-cert  Difficulty in walking, not elsewhere classified - Plan: PT plan of care cert/re-cert     Problem List Patient Active Problem List   Diagnosis Date Noted  . Patellar instability of right knee 10/31/2016  . Infected pilonidal cyst 10/22/2014  . Pilonidal sinus 07/02/2014  . Episodic tension-type headache, not intractable 12/04/2013  . Migraine without aura and without status migrainosus, not intractable 12/04/2013   KStarr LakePT, DPT, LAT, ATC  02/13/17  5:56 PM      [facility] [address]Phone: [phone]  Fax:  [phone] Name: AHolland KotterMRN: [id number] of Birth: [birth year]

## 2017-02-15 ENCOUNTER — Encounter: Payer: Medicaid Other | Admitting: Physical Therapy

## 2017-02-20 ENCOUNTER — Ambulatory Visit: Payer: Medicaid Other | Admitting: Physical Therapy

## 2017-02-20 ENCOUNTER — Encounter: Payer: Self-pay | Admitting: Physical Therapy

## 2017-02-20 DIAGNOSIS — M25561 Pain in right knee: Secondary | ICD-10-CM

## 2017-02-20 DIAGNOSIS — M25661 Stiffness of right knee, not elsewhere classified: Secondary | ICD-10-CM

## 2017-02-20 DIAGNOSIS — M6281 Muscle weakness (generalized): Secondary | ICD-10-CM

## 2017-02-20 DIAGNOSIS — M25361 Other instability, right knee: Secondary | ICD-10-CM | POA: Diagnosis not present

## 2017-02-20 DIAGNOSIS — R262 Difficulty in walking, not elsewhere classified: Secondary | ICD-10-CM

## 2017-02-20 DIAGNOSIS — R600 Localized edema: Secondary | ICD-10-CM

## 2017-02-20 DIAGNOSIS — R2689 Other abnormalities of gait and mobility: Secondary | ICD-10-CM

## 2017-02-20 NOTE — Therapy (Signed)
[place], [place], [address] Phone: [phone]   Fax:  [phone]  Physical Therapy Treatment  Patient Details  Name: Suzanne Wolfe MRN: [id number] Date of Birth: June 10, 1995 Referring Provider: Dannielle Karvonen rogers MD  Encounter Date: 02/20/2017      PT End of Session - 02/20/17 1556    Visit Number 23   Number of Visits 25   Date for PT Re-Evaluation 03/06/17   PT Start Time 1508   PT Stop Time 1546   PT Time Calculation (min) 38 min   Activity Tolerance Patient tolerated treatment well   Behavior During Therapy [facility] for tasks assessed/performed      Past Medical History:  Diagnosis Date  . History of Clostridium difficile infection 02/ 2018  resolve w/ antibiotic  . Mild asthma   . Patellar instability of right knee     Past Surgical History:  Procedure Laterality Date  . COLONOSCOPY  08-04-2016  dr stark  . ESOPHAGOGASTRODUODENOSCOPY  06/20/2016  . LAPAROSCOPIC APPENDECTOMY  07/16/2009  . MEDIAL PATELLOFEMORAL LIGAMENT REPAIR Right 10/31/2016   Procedure: Right knee arthroscopic assisted medial patella femoral ligament reconstruction;  Surgeon: Nicholes Stairs, MD;  Location: [facility];  Service: Orthopedics;  Laterality: Right;  . PILONIDAL CYST EXCISION N/A 07/02/2014   Procedure: EXCISION OF PILONIDAL CYST WITH PRIMARY CLOSURE;  Surgeon: Jerilynn Mages. Gerald Stabs, MD;  Location: [place];  Service: Pediatrics;  Laterality: N/A;  . PILONIDAL CYST EXCISION N/A 10/22/2014   Procedure: EXCISION PILONIDAL CYST AND SINUS;  Surgeon: Gerald Stabs, MD;  Location: [place];  Service: Pediatrics;  Laterality: N/A;  Re-excision  . WISDOM TOOTH EXTRACTION      There were no vitals filed for this visit.      Subjective Assessment - 02/20/17 1510    Subjective "the knee is doing pretty good"    Currently in Pain? Yes                          [facility] Adult PT Treatment/Exercise - 02/20/17 1511      Knee/Hip Exercises: Stretches   Passive Hamstring Stretch 2 reps;30 seconds   Quad Stretch 2 reps;30 seconds   Gastroc Stretch 2 reps;30 seconds   Gastroc Stretch Limitations slant board     Knee/Hip Exercises: Aerobic   Elliptical L6 x 6 min     Knee/Hip Exercises: Machines for Strengthening   Cybex Leg Press 1 x 15 55#, 1 x 15 65#     Knee/Hip Exercises: Standing   Forward Step Up 2 sets;15 reps  Bosu ball   Other Standing Knee Exercises step on each side of BOSU,  lower back knee 10 X each.  Cued to lower vs move forward.     Other Standing Knee Exercises Bosu squats 1 x 12  cues to for proper      Manual Therapy   Soft tissue mobilization lateral patellar glide 2 x 1 min             Balance Exercises - 02/20/17 1549      Balance Exercises: Standing   Rebounder Single leg;Double leg;20 reps  facing to the L and R x 3 sets           PT Education - 02/20/17 1556    Education provided Yes   Education Details reviewed cross friction massage   Person(s) Educated Patient   Methods Explanation;Verbal cues  Comprehension Verbalized understanding;Verbal cues required          PT Short Term Goals - 02/13/17 1741      PT SHORT TERM GOAL #1   Title pt will be I with inital HEP (12/07/2016)   Time 4   Period Weeks   Status Achieved     PT SHORT TERM GOAL #2   Title pt will increase R knee PROM / AAROM knee flexion to >/= 80 degrees with </= 6/10 for functional therapuetic progression (12/07/2016)   Time 4   Period Weeks   Status Achieved     PT SHORT TERM GOAL #3   Title pt will be able to elicit strong quad contraction and demo SLR wiht </= -15 extensor lag to demonstrate improvement knee function (12/07/2016)   Time 4   Period Weeks   Status Partially Met     PT SHORT TERM GOAL #4   Title pt will be able to don/ doff her brace Independently   Time 4   Period Weeks   Status Achieved            PT Long Term Goals - 02/13/17 1742      PT LONG TERM GOAL #1   Title pt will increase knee flexion to >/= 120 degrees and </= -2 degrees with </= 2/10 pain for function and efficient gait pattern (01/02/2017)   Baseline 121 flexion and -5 extension   Time 8   Period Weeks   Status Partially Met     PT LONG TERM GOAL #2   Title improve R knee/ hip strength to >/= 4/5 to provide hip/ knee stability with walking/ standing (01/02/2017)   Time 8   Period Weeks   Status Achieved   Target Date 02/27/17     PT LONG TERM GOAL #3   Title pt will be able to walk / standing for >/= 30 min and navigate up/down >/= 10 stairs reciprocally with </= +1HHA for safety for functional mobility reqired for school and work    Baseline Able to walk/stand with pain after 30 minutes with 4/10 pain.  After walking/standing 1 hour pain is 5/10.  Stairs continue to be difficult   Time 8   Period Weeks   Status Partially Met   Target Date 02/27/17     PT LONG TERM GOAL #4   Title pt will be I with all HEP given as of last visit (01/02/2017)   Baseline continued with current HEP   Time 8   Period Weeks   Status On-going   Target Date 02/27/17     PT LONG TERM GOAL #5   Title Pt will be able to stand on Rt. LE for min dynamic activity and no LOB, no pain.    Time 8   Period Weeks   Status Partially Met   Target Date 02/27/17               Plan - 02/20/17 1558    Clinical Impression Statement pt reports minimal soreness today starting PT. continued progress of hip strengthening and balance which she performed well but demonstrate apprehension with SLS rebounder balance face to the L/R required tactile cues for proper hip position. Reviewed cross friction massage which she reported 1-2/10 pain post session.    PT Next Visit Plan step-ups, strengthening of hip/ knee, balance, updated HEP, ROM, review HEP, strength, D/C   PT Home Exercise Plan heel slide using strap (stay within pain free  range),  quad set, glute set, ankle pumps, standing heel raise, wall squat, heel strike/ toe off with crutch. taping for patella, cross friction massage   Consulted and Agree with Plan of Care Patient      Patient will benefit from skilled therapeutic intervention in order to improve the following deficits and impairments:     Visit Diagnosis: Patellar instability of right knee  Localized edema  Muscle weakness (generalized)  Other abnormalities of gait and mobility  Stiffness of right knee, not elsewhere classified  Difficulty in walking, not elsewhere classified  Acute pain of right knee     Problem List Patient Active Problem List   Diagnosis Date Noted  . Patellar instability of right knee 10/31/2016  . Infected pilonidal cyst 10/22/2014  . Pilonidal sinus 07/02/2014  . Episodic tension-type headache, not intractable 12/04/2013  . Migraine without aura and without status migrainosus, not intractable 12/04/2013   Starr Lake PT, DPT, LAT, ATC  02/20/17  4:01 PM      [facility] [address] [place], [place], [address] Phone: [phone]   Fax:  [phone]  Name: Christan Ciccarelli MRN: [id number] Date of Birth: 1995-05-18

## 2017-02-22 ENCOUNTER — Encounter: Payer: Medicaid Other | Admitting: Physical Therapy

## 2017-02-27 ENCOUNTER — Ambulatory Visit: Payer: Medicaid Other | Admitting: Physical Therapy

## 2017-02-27 ENCOUNTER — Encounter: Payer: Self-pay | Admitting: Physical Therapy

## 2017-02-27 DIAGNOSIS — M6281 Muscle weakness (generalized): Secondary | ICD-10-CM

## 2017-02-27 DIAGNOSIS — R2689 Other abnormalities of gait and mobility: Secondary | ICD-10-CM

## 2017-02-27 DIAGNOSIS — R600 Localized edema: Secondary | ICD-10-CM

## 2017-02-27 DIAGNOSIS — M25361 Other instability, right knee: Secondary | ICD-10-CM

## 2017-02-27 NOTE — Therapy (Signed)
[place], [place], [address] Phone: [phone]   Fax:  [phone]  Physical Therapy Treatment  Patient Details  Name: Suzanne Wolfe MRN: [id number] Date of Birth: May 24, 1995 Referring Provider: Dannielle Karvonen rogers MD  Encounter Date: 02/27/2017      PT End of Session - 02/27/17 1744    Visit Number 24   Number of Visits 25   Date for PT Re-Evaluation 03/06/17   PT Start Time 4680   PT Stop Time 1625   PT Time Calculation (min) 40 min   Activity Tolerance Patient tolerated treatment well   Behavior During Therapy [facility] for tasks assessed/performed      Past Medical History:  Diagnosis Date  . History of Clostridium difficile infection 02/ 2018  resolve w/ antibiotic  . Mild asthma   . Patellar instability of right knee     Past Surgical History:  Procedure Laterality Date  . COLONOSCOPY  08-04-2016  dr stark  . ESOPHAGOGASTRODUODENOSCOPY  06/20/2016  . LAPAROSCOPIC APPENDECTOMY  07/16/2009  . MEDIAL PATELLOFEMORAL LIGAMENT REPAIR Right 10/31/2016   Procedure: Right knee arthroscopic assisted medial patella femoral ligament reconstruction;  Surgeon: Nicholes Stairs, MD;  Location: [facility];  Service: Orthopedics;  Laterality: Right;  . PILONIDAL CYST EXCISION N/A 07/02/2014   Procedure: EXCISION OF PILONIDAL CYST WITH PRIMARY CLOSURE;  Surgeon: Jerilynn Mages. Gerald Stabs, MD;  Location: [facility];  Service: Pediatrics;  Laterality: N/A;  . PILONIDAL CYST EXCISION N/A 10/22/2014   Procedure: EXCISION PILONIDAL CYST AND SINUS;  Surgeon: Gerald Stabs, MD;  Location: [facility];  Service: Pediatrics;  Laterality: N/A;  Re-excision  . WISDOM TOOTH EXTRACTION      There were no vitals filed for this visit.      Subjective Assessment - 02/27/17 1550    Subjective "Im not having any trouble besides some tenderness in the knee"    Currently in Pain? No/denies    Aggravating Factors  going from bend to straight   Pain Relieving Factors rubbing, stretching exercise            [facility] PT Assessment - 02/27/17 1551      AROM   Right Knee Extension -2   Right Knee Flexion 123     Strength   Right Knee Flexion 4+/5   Right Knee Extension 4+/5                     [facility] Adult PT Treatment/Exercise - 02/27/17 1747      Knee/Hip Exercises: Aerobic   Recumbent Bike L4 x 6 min     Knee/Hip Exercises: Standing   Step Down 2 sets;15 reps;Step Height: 6"   Rebounder 2 x 20 tosses using red ball with R SLS   Gait Training navigating up/down stairs 4 x with 6 inch step     Knee/Hip Exercises: Seated   Sit to Sand 1 set;10 reps;without UE support                PT Education - 02/27/17 1744    Education provided Yes   Education Details reviewed previously provided HEP, benefits of continued exercise to maintain current level of function.    Person(s) Educated Patient   Methods Explanation;Verbal cues   Comprehension Verbalized understanding;Verbal cues required          PT Short Term Goals - 02/27/17 1614      PT SHORT TERM GOAL #1  Title pt will be I with inital HEP (12/07/2016)   Time 4   Period Weeks   Status Achieved     PT SHORT TERM GOAL #2   Title pt will increase R knee PROM / AAROM knee flexion to >/= 80 degrees with </= 6/10 for functional therapuetic progression (12/07/2016)   Time 4   Period Weeks   Status Achieved     PT SHORT TERM GOAL #3   Title pt will be able to elicit strong quad contraction and demo SLR wiht </= -15 extensor lag to demonstrate improvement knee function (12/07/2016)   Time 4   Period Weeks   Status Achieved     PT SHORT TERM GOAL #4   Title pt will be able to don/ doff her brace Independently   Time 4   Period Weeks   Status Achieved           PT Long Term Goals - 02/27/17 1615      PT LONG TERM GOAL #1   Title pt will increase knee flexion to >/= 120 degrees  and </= -2 degrees with </= 2/10 pain for function and efficient gait pattern (01/02/2017)   Time 8   Period Weeks   Status Achieved     PT LONG TERM GOAL #2   Title improve R knee/ hip strength to >/= 4/5 to provide hip/ knee stability with walking/ standing (01/02/2017)   Time 8   Period Weeks   Status Achieved     PT LONG TERM GOAL #3   Title pt will be able to walk / standing for >/= 30 min and navigate up/down >/= 10 stairs reciprocally with </= +1HHA for safety for functional mobility reqired for school and work    Time 8   Period Weeks   Status Achieved     PT LONG TERM GOAL #4   Title pt will be I with all HEP given as of last visit (01/02/2017)   Time 8   Period Weeks   Status Achieved     PT LONG TERM GOAL #5   Title Pt will be able to stand on Rt. LE for min dynamic activity and no LOB, no pain.    Time 8   Period Weeks   Status Achieved               Plan - 02/27/17 1745    Clinical Impression Statement pt reports no pain today, she demonstrated increase Knee ROm from -2 to 123 degrees and improve knee strength. she performed all exericses today with minimal report of soreness in the distal incision but no pain inthe knee. she met all goals today and is able to maintain and progress her current level of function independenly and will be discharged from PT.    PT Treatment/Interventions ADLs/Self Care Home Management;Therapeutic exercise;Ultrasound;Cryotherapy;Electrical Stimulation;Iontophoresis 43m/ml Dexamethasone;Functional mobility training;Stair training;Neuromuscular re-education;Manual techniques;Patient/family education;Passive range of motion;Vasopneumatic Device;Therapeutic activities;Moist Heat   PT Next Visit Plan D/C   PT Home Exercise Plan heel slide using strap (stay within pain free range), quad set, glute set, ankle pumps, standing heel raise, wall squat, heel strike/ toe off with crutch. taping for patella, cross friction massage   Consulted and  Agree with Plan of Care Patient      Patient will benefit from skilled therapeutic intervention in order to improve the following deficits and impairments:  Decreased range of motion, Increased fascial restricitons, Pain, Impaired flexibility, Increased edema, Decreased strength, Decreased mobility, Decreased endurance,  Decreased activity tolerance  Visit Diagnosis: Patellar instability of right knee  Localized edema  Muscle weakness (generalized)  Other abnormalities of gait and mobility     Problem List Patient Active Problem List   Diagnosis Date Noted  . Patellar instability of right knee 10/31/2016  . Infected pilonidal cyst 10/22/2014  . Pilonidal sinus 07/02/2014  . Episodic tension-type headache, not intractable 12/04/2013  . Migraine without aura and without status migrainosus, not intractable 12/04/2013   Starr Lake PT, DPT, LAT, ATC  02/27/17  5:51 PM      [facility] [address] [place], [place], [address] Phone: [phone]   Fax:  [phone]  Name: Suzanne Wolfe MRN: [id number] Date of Birth: Feb 05, 1996     PHYSICAL THERAPY DISCHARGE SUMMARY  Visits from Start of Care: 24  Current functional level related to goals / functional outcomes: See goals   Remaining deficits: See above assessmetn   Education / Equipment: HEP, theraband, posture/mechanics  Plan: Patient agrees to discharge.  Patient goals were met. Patient is being discharged due to meeting the stated rehab goals.  ?????     Jeanmarc Viernes PT, DPT, LAT, ATC  02/27/17  5:51 PM

## 2017-03-01 ENCOUNTER — Encounter: Payer: Medicaid Other | Admitting: Physical Therapy

## 2017-03-06 ENCOUNTER — Ambulatory Visit: Payer: Medicaid Other | Admitting: Physical Therapy

## 2017-03-08 ENCOUNTER — Encounter: Payer: Medicaid Other | Admitting: Physical Therapy

## 2017-05-04 ENCOUNTER — Other Ambulatory Visit: Payer: Self-pay

## 2017-05-04 ENCOUNTER — Encounter (HOSPITAL_COMMUNITY): Payer: Self-pay

## 2017-05-04 ENCOUNTER — Emergency Department (HOSPITAL_COMMUNITY)
Admission: EM | Admit: 2017-05-04 | Discharge: 2017-05-04 | Disposition: A | Discharge status: Home / Self Care | Payer: Medicaid Other | Attending: Emergency Medicine | Admitting: Emergency Medicine

## 2017-05-04 DIAGNOSIS — K6289 Other specified diseases of anus and rectum: Secondary | ICD-10-CM | POA: Insufficient documentation

## 2017-05-04 DIAGNOSIS — J45909 Unspecified asthma, uncomplicated: Secondary | ICD-10-CM | POA: Insufficient documentation

## 2017-05-04 DIAGNOSIS — Z79899 Other long term (current) drug therapy: Secondary | ICD-10-CM | POA: Insufficient documentation

## 2017-05-04 DIAGNOSIS — K602 Anal fissure, unspecified: Secondary | ICD-10-CM

## 2017-05-04 LAB — POC OCCULT BLOOD, ED: Fecal Occult Bld: POSITIVE — AB

## 2017-05-04 LAB — COMPREHENSIVE METABOLIC PANEL
ALT: 24 U/L (ref 14–54)
ANION GAP: 9 (ref 5–15)
AST: 26 U/L (ref 15–41)
Albumin: 3.7 g/dL (ref 3.5–5.0)
Alkaline Phosphatase: 48 U/L (ref 38–126)
BUN: 10 mg/dL (ref 6–20)
CHLORIDE: 105 mmol/L (ref 101–111)
CO2: 21 mmol/L — ABNORMAL LOW (ref 22–32)
Calcium: 9 mg/dL (ref 8.9–10.3)
Creatinine, Ser: 0.78 mg/dL (ref 0.44–1.00)
Glucose, Bld: 75 mg/dL (ref 65–99)
POTASSIUM: 3.9 mmol/L (ref 3.5–5.1)
Sodium: 135 mmol/L (ref 135–145)
Total Bilirubin: 0.5 mg/dL (ref 0.3–1.2)
Total Protein: 7 g/dL (ref 6.5–8.1)

## 2017-05-04 LAB — I-STAT BETA HCG BLOOD, ED (MC, WL, AP ONLY)

## 2017-05-04 LAB — CBC
HEMATOCRIT: 41.1 % (ref 36.0–46.0)
Hemoglobin: 13.6 g/dL (ref 12.0–15.0)
MCH: 26.1 pg (ref 26.0–34.0)
MCHC: 33.1 g/dL (ref 30.0–36.0)
MCV: 78.9 fL (ref 78.0–100.0)
Platelets: 290 10*3/uL (ref 150–400)
RBC: 5.21 MIL/uL — AB (ref 3.87–5.11)
RDW: 13.9 % (ref 11.5–15.5)
WBC: 8.5 10*3/uL (ref 4.0–10.5)

## 2017-05-04 MED ORDER — AMOXICILLIN-POT CLAVULANATE 875-125 MG PO TABS: 1.0000 | ORAL_TABLET | Freq: Two times a day (BID) | ORAL | 0 refills | Status: DC

## 2017-05-04 NOTE — ED Triage Notes (Signed)
Pt states she has been having rectal bleeding for about 1 week. She states this happens every 6 months. Pt also reports an episode of vomiting. Pt states she has bleeding with gas and with bowl movements. Reports some blood clots as well.

## 2017-05-04 NOTE — Discharge Instructions (Signed)
There appears to be 2 problems, causing your discomfort.  You have an anal fissure, which is causing the bleeding.  Also you appear to have a small infected gland, that will require antibiotic treatment.  To improve both of these problems, soak in a warm tub 2 or 3 times a day then clean the area around her anus well with soap and water.  To cleanse after bowel movements, use something like a diaper wipe or medicated pad, to help keep the area clean.  It is important to have soft bowel movements when you have an anal fissure, so use Colace 100 mg, twice a day as a stool softener to promote soft stools.  Follow-up with your gastroenterologist, for further evaluation and treatment.  If the fissure does not improve with symptomatic treatment, you may need surgery.

## 2017-05-04 NOTE — ED Provider Notes (Signed)
[facility] EMERGENCY DEPARTMENT Provider Note   CSN: [id number] Arrival date & time: 05/04/17  1639     History   Chief Complaint Chief Complaint  Patient presents with  . Rectal Bleeding    HPI Suzanne Wolfe is a 21 y.o. female.  She complains of intermittent rectal bleeding for 1 week, with similar symptoms ongoing for years, about every 6 months.  She has occasional constipation.  She denies fever, chills, nausea, vomiting, weakness or dizziness.  She was evaluated with colonoscopy, 7 months ago with negative findings.  She presents with pictures, on her phone, showing clots on tissue, and blood in a commode, which she has passed within the last week.  She has not had pain with defecation.  She denies diarrhea.  There are no other known modifying factors.  HPI  Past Medical History:  Diagnosis Date  . History of Clostridium difficile infection 02/ 2018  resolve w/ antibiotic  . Mild asthma   . Patellar instability of right knee     Patient Active Problem List   Diagnosis Date Noted  . Patellar instability of right knee 10/31/2016  . Infected pilonidal cyst 10/22/2014  . Pilonidal sinus 07/02/2014  . Episodic tension-type headache, not intractable 12/04/2013  . Migraine without aura and without status migrainosus, not intractable 12/04/2013    Past Surgical History:  Procedure Laterality Date  . COLONOSCOPY  08-04-2016  dr stark  . ESOPHAGOGASTRODUODENOSCOPY  06/20/2016  . LAPAROSCOPIC APPENDECTOMY  07/16/2009  . MEDIAL PATELLOFEMORAL LIGAMENT REPAIR Right 10/31/2016   Procedure: Right knee arthroscopic assisted medial patella femoral ligament reconstruction;  Surgeon: Yolonda Kida, MD;  Location: [facility];  Service: Orthopedics;  Laterality: Right;  . PILONIDAL CYST EXCISION N/A 07/02/2014   Procedure: EXCISION OF PILONIDAL CYST WITH PRIMARY CLOSURE;  Surgeon: Judie Petit. Leonia Corona, MD;  Location: [facility];   Service: Pediatrics;  Laterality: N/A;  . PILONIDAL CYST EXCISION N/A 10/22/2014   Procedure: EXCISION PILONIDAL CYST AND SINUS;  Surgeon: Leonia Corona, MD;  [facility];  Service: Pediatrics;  Laterality: N/A;  Re-excision  . WISDOM TOOTH EXTRACTION      OB History    No data available       Home Medications    Prior to Admission medications   Medication Sig Start Date End Date Taking? Authorizing Provider  acetaminophen (TYLENOL) 500 MG tablet Take 500 mg by mouth every 6 (six) hours as needed.    [provider]  ALBUTEROL IN Inhale into the lungs.    [provider]  amoxicillin-clavulanate (AUGMENTIN) 875-125 MG tablet Take 1 tablet by mouth 2 (two) times daily. One po bid x 7 days 05/04/17   Mancel Bale, MD  Diclofenac Sodium (PENNSAID) 2 % SOLN Place onto the skin 2 (two) times daily.    [provider]  ibuprofen (ADVIL,MOTRIN) 200 MG tablet Take 200 mg by mouth every 6 (six) hours as needed.    [provider]  norgestimate-ethinyl estradiol (ESTARYLLA) 0.25-35 MG-MCG tablet Take 1 tablet by mouth every evening.    [provider]  ondansetron (ZOFRAN ODT) 4 MG disintegrating tablet Take 1 tablet (4 mg total) by mouth every 8 (eight) hours as needed for nausea or vomiting. 10/31/16   Yolonda Kida, MD  predniSONE (DELTASONE) 10 MG tablet Take 2 tablets (20 mg total) by mouth daily. 01/30/17   Marlon Pel, PA-C    Family History Family History  Problem Relation Age  of Onset  . Diabetes Maternal Grandmother   . Hypertension Maternal Grandmother   . Heart disease Maternal Grandmother        hx. open heart surgery  . Asthma Father   . Colon cancer Neg Hx   . Esophageal cancer Neg Hx     Social History Social History   Tobacco Use  . Smoking status: Never Smoker  . Smokeless tobacco: Never Used  Substance Use Topics  . Alcohol use: No  . Drug use: No     Allergies   Patient has no  known allergies.   Review of Systems Review of Systems  All other systems reviewed and are negative.    Physical Exam Updated Vital Signs BP 122/90   Pulse 70   Temp 98.9 F (37.2 C) (Oral)   Resp 16   LMP 04/21/2017 (Exact Date)   SpO2 100%   Physical Exam  Constitutional: She is oriented to person, place, and time. She appears well-developed and well-nourished.  HENT:  Head: Normocephalic and atraumatic.  Eyes: Conjunctivae and EOM are normal. Pupils are equal, round, and reactive to light.  Neck: Normal range of motion and phonation normal. Neck supple.  Cardiovascular: Normal rate.  Pulmonary/Chest: Effort normal.  Abdominal: Soft. She exhibits no distension. There is no tenderness. There is no guarding.  Genitourinary:  Genitourinary Comments: Anus with posterior anal fissure, actively bleeding.  There is also a small pustule, and or umbilication, with drainage of a small amount of white material, possibly pus.  This anterior area is somewhat tender, but there is no deformity of the anus.  On digital examination, there is no palpable anal or rectal mass.  There was no stool in the rectum.  Musculoskeletal: Normal range of motion.  Neurological: She is alert and oriented to person, place, and time. She exhibits normal muscle tone.  Skin: Skin is warm and dry.  Psychiatric: She has a normal mood and affect. Her behavior is normal. Judgment and thought content normal.  Nursing note and vitals reviewed.    ED Treatments / Results  Labs (all labs ordered are listed, but only abnormal results are displayed) Labs Reviewed  COMPREHENSIVE METABOLIC PANEL - Abnormal; Notable for the following components:      Result Value   CO2 21 (*)    All other components within normal limits  CBC - Abnormal; Notable for the following components:   RBC 5.21 (*)    All other components within normal limits  POC OCCULT BLOOD, ED - Abnormal; Notable for the following components:   Fecal  Occult Bld POSITIVE (*)    All other components within normal limits  I-STAT BETA HCG BLOOD, ED ([facility], [facility], [facility] ONLY)    EKG  EKG Interpretation None       Radiology No results found.  Procedures Procedures (including critical care time)  Medications Ordered in ED Medications - No data to display   Initial Impression / Assessment and Plan / ED Course  I have reviewed the triage vital signs and the nursing notes.  Pertinent labs & imaging results that were available during my care of the patient were reviewed by me and considered in my medical decision making (see chart for details).      Patient Vitals for the past 24 hrs:  BP Temp Temp src Pulse Resp SpO2  05/04/17 2100 122/90 - - 70 16 100 %  05/04/17 1701 128/79 98.9 F (37.2 C) Oral 70 16 100 %  9:47 PM Reevaluation with update and discussion. After initial assessment and treatment, an updated evaluation reveals, she remains comfortable, findings discussed with patient and all questions answered. Mancel BaleElliott Kathi Dohn      Final Clinical Impressions(s) / ED Diagnoses   Final diagnoses:  Anal fissure  Anal infection    Recurrent rectal bleeding which appears to be from a anal fissure, which is acute at this time.  There is no palpable ridge, which would be consistent with a chronic anal fissure.  Fissure was posterior, and anterior there appears to be a small pustule, versus infected gland.  Doubt rectal fistula, colon bleeding, rectal bleeding, hemodynamic instability or metabolic instability.  Nursing Notes Reviewed/ Care Coordinated Applicable Imaging Reviewed Interpretation of Laboratory Data incorporated into ED treatment  The patient appears reasonably screened and/or stabilized for discharge and I doubt any other medical condition or other [facility] requiring further screening, evaluation, or treatment in the ED at this time prior to discharge.  Plan: Home Medications-Colace, for stool softener; Home Treatments-warm  soaks 3 times daily; return here if the recommended treatment, does not improve the symptoms; Recommended follow up-GI follow-up 3 weeks and as needed   ED Discharge Orders        Ordered    amoxicillin-clavulanate (AUGMENTIN) 875-125 MG tablet  2 times daily     05/04/17 2154       Mancel BaleWentz, Katrice Goel, MD 05/04/17 2156

## 2017-05-21 ENCOUNTER — Emergency Department (HOSPITAL_COMMUNITY): Payer: BLUE CROSS/BLUE SHIELD

## 2017-05-21 ENCOUNTER — Emergency Department (HOSPITAL_COMMUNITY)
Admission: EM | Admit: 2017-05-21 | Discharge: 2017-05-21 | Disposition: A | Discharge status: Home / Self Care | Payer: BLUE CROSS/BLUE SHIELD | Attending: Emergency Medicine | Admitting: Emergency Medicine

## 2017-05-21 ENCOUNTER — Encounter (HOSPITAL_COMMUNITY): Payer: Self-pay

## 2017-05-21 DIAGNOSIS — Y9389 Activity, other specified: Secondary | ICD-10-CM | POA: Insufficient documentation

## 2017-05-21 DIAGNOSIS — Y999 Unspecified external cause status: Secondary | ICD-10-CM | POA: Diagnosis not present

## 2017-05-21 DIAGNOSIS — Z79899 Other long term (current) drug therapy: Secondary | ICD-10-CM | POA: Insufficient documentation

## 2017-05-21 DIAGNOSIS — M7918 Myalgia, other site: Secondary | ICD-10-CM

## 2017-05-21 DIAGNOSIS — Y92410 Unspecified street and highway as the place of occurrence of the external cause: Secondary | ICD-10-CM | POA: Insufficient documentation

## 2017-05-21 DIAGNOSIS — M79602 Pain in left arm: Secondary | ICD-10-CM | POA: Insufficient documentation

## 2017-05-21 MED ORDER — IBUPROFEN 600 MG PO TABS: 600.0000 mg | ORAL_TABLET | Freq: Four times a day (QID) | ORAL | 0 refills | Status: DC | PRN

## 2017-05-21 MED ORDER — CYCLOBENZAPRINE HCL 10 MG PO TABS: 10.0000 mg | ORAL_TABLET | Freq: Two times a day (BID) | ORAL | 0 refills | Status: DC | PRN

## 2017-05-21 MED ORDER — OXYCODONE-ACETAMINOPHEN 5-325 MG PO TABS
1.0000 | ORAL_TABLET | ORAL | Status: DC | PRN
  Administered 2017-05-21: 1 via ORAL
  Filled 2017-05-21: qty 1
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
## 2017-05-21 NOTE — Discharge Instructions (Signed)
Evaluation today is reassuring, x-ray showed no evidence of acute fracture.  The pain your experiencing is likely due to muscle strain, you may take Ibuprofen and flexeril as needed for pain management. Do not combine with any pain reliever other than tylenol. The muscle soreness should improve over the next week. Follow up with your family doctor in the next week for a recheck if you are still having symptoms. Return to ED if pain is worsening, you develop weakness or numbness of extremities, or new or concerning symptoms develop.

## 2017-05-21 NOTE — ED Notes (Signed)
Patient transported to X-ray 

## 2017-07-19 DIAGNOSIS — J4540 Moderate persistent asthma, uncomplicated: Secondary | ICD-10-CM | POA: Insufficient documentation
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
## 2019-01-21 DIAGNOSIS — R7611 Nonspecific reaction to tuberculin skin test without active tuberculosis: Secondary | ICD-10-CM | POA: Insufficient documentation

## 2019-08-18 ENCOUNTER — Ambulatory Visit: Payer: Self-pay | Admitting: Obstetrics and Gynecology

## 2019-08-18 DIAGNOSIS — Z0289 Encounter for other administrative examinations: Secondary | ICD-10-CM

## 2019-08-31 ENCOUNTER — Other Ambulatory Visit: Payer: Self-pay

## 2019-08-31 ENCOUNTER — Encounter (HOSPITAL_COMMUNITY): Payer: Self-pay

## 2019-08-31 ENCOUNTER — Ambulatory Visit (HOSPITAL_COMMUNITY)
Admission: EM | Admit: 2019-08-31 | Discharge: 2019-08-31 | Disposition: A | Discharge status: Home / Self Care | Payer: Medicaid Other | Attending: Family Medicine | Admitting: Family Medicine

## 2019-08-31 DIAGNOSIS — L0591 Pilonidal cyst without abscess: Secondary | ICD-10-CM | POA: Diagnosis not present

## 2019-08-31 MED ORDER — DOXYCYCLINE HYCLATE 100 MG PO CAPS: 100.0000 mg | ORAL_CAPSULE | Freq: Two times a day (BID) | ORAL | 0 refills | Status: AC

## 2019-08-31 MED ORDER — IBUPROFEN 800 MG PO TABS: 800.0000 mg | ORAL_TABLET | Freq: Three times a day (TID) | ORAL | 0 refills | Status: DC

## 2019-08-31 MED ORDER — TRAMADOL HCL 50 MG PO TABS: 50.0000 mg | ORAL_TABLET | Freq: Four times a day (QID) | ORAL | 0 refills | Status: DC | PRN

## 2019-08-31 NOTE — ED Provider Notes (Signed)
[facility]    CSN: [id number] Arrival date & time: 08/31/19  1116      History   Chief Complaint Chief Complaint  Patient presents with  . possible pilonidal cyst, tail bone pain    HPI Suzanne Wolfe is a 24 y.o. female does not have a past medical history presenting today for evaluation of a pilonidal abscess.  Patient notes that over the past 2 days she has developed increased pain to her pilonidal area.  She has had 2 prior surgeries for cyst removal.  Feels similar to the past.  Denies any injury.  Has a lot of pain with pressure to this area.  Denies drainage.  Denies rectal pain.  Denies fevers.  Last surgery 6 years ago. HPI  Past Medical History:  Diagnosis Date  . History of Clostridium difficile infection 02/ 2018  resolve w/ antibiotic  . Mild asthma   . Patellar instability of right knee     Patient Active Problem List   Diagnosis Date Noted  . Patellar instability of right knee 10/31/2016  . Infected pilonidal cyst 10/22/2014  . Pilonidal sinus 07/02/2014  . Episodic tension-type headache, not intractable 12/04/2013  . Migraine without aura and without status migrainosus, not intractable 12/04/2013    Past Surgical History:  Procedure Laterality Date  . COLONOSCOPY  08-04-2016  dr stark  . ESOPHAGOGASTRODUODENOSCOPY  06/20/2016  . LAPAROSCOPIC APPENDECTOMY  07/16/2009  . MEDIAL PATELLOFEMORAL LIGAMENT REPAIR Right 10/31/2016   Procedure: Right knee arthroscopic assisted medial patella femoral ligament reconstruction;  Surgeon: Yolonda Kida, MD;  Location: [facility];  Service: Orthopedics;  Laterality: Right;  . PILONIDAL CYST EXCISION N/A 07/02/2014   Procedure: EXCISION OF PILONIDAL CYST WITH PRIMARY CLOSURE;  Surgeon: Judie Petit. Leonia Corona, MD;  Location: [facility];  Service: Pediatrics;  Laterality: N/A;  . PILONIDAL CYST EXCISION N/A 10/22/2014   Procedure: EXCISION PILONIDAL CYST AND SINUS;  Surgeon: Leonia Corona, MD;  Location:  [facility];  Service: Pediatrics;  Laterality: N/A;  Re-excision  . WISDOM TOOTH EXTRACTION      OB History   No obstetric history on file.      Home Medications    Prior to Admission medications   Medication Sig Start Date End Date Taking? Authorizing Provider  ALBUTEROL IN Inhale into the lungs.   Yes [provider]  acetaminophen (TYLENOL) 500 MG tablet Take 500 mg by mouth every 6 (six) hours as needed.    [provider]  doxycycline (VIBRAMYCIN) 100 MG capsule Take 1 capsule (100 mg total) by mouth 2 (two) times daily for 10 days. 08/31/19 09/10/19  Marlowe Cinquemani C, PA-C  ibuprofen (ADVIL) 800 MG tablet Take 1 tablet (800 mg total) by mouth 3 (three) times daily. 08/31/19   Alzada Brazee C, PA-C  norgestimate-ethinyl estradiol (ESTARYLLA) 0.25-35 MG-MCG tablet Take 1 tablet by mouth every evening.    [provider]  traMADol (ULTRAM) 50 MG tablet Take 1 tablet (50 mg total) by mouth every 6 (six) hours as needed. 08/31/19   Maykel Reitter, Junius Creamer, PA-C    Family History Family History  Problem Relation Age of Onset  . Diabetes Maternal Grandmother   . Hypertension Maternal Grandmother   . Heart disease Maternal Grandmother        hx. open heart surgery  . Asthma Father   . Colon cancer Neg Hx   . Esophageal cancer Neg Hx     Social History Social History  Tobacco Use  . Smoking status: Never Smoker  . Smokeless tobacco: Never Used  Substance Use Topics  . Alcohol use: No  . Drug use: No     Allergies   Patient has no known allergies.   Review of Systems Review of Systems  Constitutional: Negative for fatigue and fever.  Eyes: Negative for visual disturbance.  Respiratory: Negative for shortness of breath.   Cardiovascular: Negative for chest pain.  Gastrointestinal: Negative for abdominal pain, nausea and vomiting.  Musculoskeletal: Positive for myalgias. Negative for arthralgias and joint  swelling.  Skin: Positive for color change. Negative for rash and wound.  Neurological: Negative for dizziness, weakness, light-headedness and headaches.     Physical Exam Triage Vital Signs ED Triage Vitals  Enc Vitals Group     BP 08/31/19 1233 115/71     Pulse Rate 08/31/19 1233 81     Resp 08/31/19 1233 16     Temp 08/31/19 1233 98.3 F (36.8 C)     Temp Source 08/31/19 1233 Oral     SpO2 08/31/19 1233 100 %     Weight --      Height --      Head Circumference --      Peak Flow --      Pain Score 08/31/19 1230 8     Pain Loc --      Pain Edu? --      Excl. in [place]? --    No data found.  Updated Vital Signs BP 115/71 (BP Location: Left Arm)   Pulse 81   Temp 98.3 F (36.8 C) (Oral)   Resp 16   LMP 07/30/2019   SpO2 100%   Visual Acuity Right Eye Distance:   Left Eye Distance:   Bilateral Distance:    Right Eye Near:   Left Eye Near:    Bilateral Near:     Physical Exam Vitals and nursing note reviewed.  Constitutional:      Appearance: She is well-developed.     Comments: No acute distress  HENT:     Head: Normocephalic and atraumatic.     Nose: Nose normal.  Eyes:     Conjunctiva/sclera: Conjunctivae normal.  Cardiovascular:     Rate and Rhythm: Normal rate.  Pulmonary:     Effort: Pulmonary effort is normal. No respiratory distress.  Abdominal:     General: There is no distension.  Genitourinary:    Comments: Pilonidal area with small area of erythema surrounding black pinpoint center, no induration or fluctuance palpated, tender to palpation in this area Musculoskeletal:        General: Normal range of motion.     Cervical back: Neck supple.  Skin:    General: Skin is warm and dry.  Neurological:     Mental Status: She is alert and oriented to person, place, and time.      UC Treatments / Results  Labs (all labs ordered are listed, but only abnormal results are displayed) Labs Reviewed - No data to display  EKG   Radiology No  results found.  Procedures Procedures (including critical care time)  Medications Ordered in UC Medications - No data to display  Initial Impression / Assessment and Plan / UC Course  I have reviewed the triage vital signs and the nursing notes.  Pertinent labs & imaging results that were available during my care of the patient were reviewed by me and considered in my medical decision making (see chart for details).  Likely recurrent pilonidal cyst that is infected versus inflamed.  Initiating on antibiotics with doxycycline, anti-inflammatories.  Provided tramadol for severe pain.  No sign of abscess or need for I&D at this time.  Discussed close monitoring.  May follow-up with surgery for reevaluation given history of recurrent pilonidal cyst.  Discussed strict return precautions. Patient verbalized understanding and is agreeable with plan.  Final Clinical Impressions(s) / UC Diagnoses   Final diagnoses:  Pilonidal cyst     Discharge Instructions     Begin doxycycline for the next 10 days Warm compresses/sitz bath's Use anti-inflammatories for pain/swelling. You may take up to 800 mg Ibuprofen every 8 hours with food. You may supplement Ibuprofen with Tylenol [phone] mg every 8 hours.  Tramadol for severe pain Follow-up with surgery if needed   ED Prescriptions    Medication Sig Dispense Auth. Provider   doxycycline (VIBRAMYCIN) 100 MG capsule Take 1 capsule (100 mg total) by mouth 2 (two) times daily for 10 days. 20 capsule Dontre Laduca C, PA-C   ibuprofen (ADVIL) 800 MG tablet Take 1 tablet (800 mg total) by mouth 3 (three) times daily. 21 tablet Ronak Duquette C, PA-C   traMADol (ULTRAM) 50 MG tablet Take 1 tablet (50 mg total) by mouth every 6 (six) hours as needed. 12 tablet Akayla Brass, [place] C, PA-C     I have reviewed the PDMP during this encounter.   Lew Dawes, [place] 08/31/19 1339

## 2019-08-31 NOTE — ED Triage Notes (Signed)
Pt reports acute onset lumbar/sacral pain yesterday in same area as previous pilonidal cyst was removed. Pt unable to sit comfortably. Denies fever, drainage.

## 2019-08-31 NOTE — Discharge Instructions (Signed)
Begin doxycycline for the next 10 days Warm compresses/sitz bath's Use anti-inflammatories for pain/swelling. You may take up to 800 mg Ibuprofen every 8 hours with food. You may supplement Ibuprofen with Tylenol [phone] mg every 8 hours.  Tramadol for severe pain Follow-up with surgery if needed

## 2019-11-20 ENCOUNTER — Emergency Department (HOSPITAL_COMMUNITY)
Admission: EM | Admit: 2019-11-20 | Discharge: 2019-11-21 | Disposition: A | Discharge status: Home / Self Care | Payer: 59 | Attending: Emergency Medicine | Admitting: Emergency Medicine

## 2019-11-20 ENCOUNTER — Other Ambulatory Visit: Payer: Self-pay

## 2019-11-20 ENCOUNTER — Encounter (HOSPITAL_COMMUNITY): Payer: Self-pay | Admitting: Emergency Medicine

## 2019-11-20 DIAGNOSIS — Z5321 Procedure and treatment not carried out due to patient leaving prior to being seen by health care provider: Secondary | ICD-10-CM | POA: Diagnosis not present

## 2019-11-20 DIAGNOSIS — R0602 Shortness of breath: Secondary | ICD-10-CM | POA: Insufficient documentation

## 2019-11-20 MED ORDER — ALBUTEROL SULFATE (2.5 MG/3ML) 0.083% IN NEBU
5.0000 mg | INHALATION_SOLUTION | Freq: Once | RESPIRATORY_TRACT | Status: AC
  Administered 2019-11-20: 5 mg via RESPIRATORY_TRACT
  Filled 2019-11-20: qty 6

## 2019-11-20 NOTE — ED Triage Notes (Signed)
Pt states she is having asthma exacerbation for the past hour, run out of her inhaler and neb treatment.

## 2019-11-21 NOTE — ED Notes (Signed)
lwbs

## 2020-03-15 ENCOUNTER — Ambulatory Visit (HOSPITAL_COMMUNITY)
Admission: EM | Admit: 2020-03-15 | Discharge: 2020-03-15 | Disposition: A | Discharge status: Home / Self Care | Payer: 59 | Attending: Urgent Care | Admitting: Urgent Care

## 2020-03-15 ENCOUNTER — Encounter (HOSPITAL_COMMUNITY): Payer: Self-pay | Admitting: Urgent Care

## 2020-03-15 ENCOUNTER — Other Ambulatory Visit: Payer: Self-pay

## 2020-03-15 DIAGNOSIS — N939 Abnormal uterine and vaginal bleeding, unspecified: Secondary | ICD-10-CM | POA: Insufficient documentation

## 2020-03-15 DIAGNOSIS — R300 Dysuria: Secondary | ICD-10-CM | POA: Diagnosis present

## 2020-03-15 DIAGNOSIS — N3001 Acute cystitis with hematuria: Secondary | ICD-10-CM | POA: Insufficient documentation

## 2020-03-15 DIAGNOSIS — Z3202 Encounter for pregnancy test, result negative: Secondary | ICD-10-CM

## 2020-03-15 LAB — POCT URINALYSIS DIPSTICK, ED / UC
Glucose, UA: 100 mg/dL — AB
Ketones, ur: 40 mg/dL — AB
Nitrite: POSITIVE — AB
Protein, ur: 30 mg/dL — AB
Specific Gravity, Urine: 1.02 (ref 1.005–1.030)
Urobilinogen, UA: 1 mg/dL (ref 0.0–1.0)
pH: 5 (ref 5.0–8.0)

## 2020-03-15 LAB — POC URINE PREG, ED: Preg Test, Ur: NEGATIVE

## 2020-03-15 MED ORDER — SULFAMETHOXAZOLE-TRIMETHOPRIM 800-160 MG PO TABS: 1.0000 | ORAL_TABLET | Freq: Two times a day (BID) | ORAL | 0 refills | Status: DC

## 2020-03-15 MED ORDER — FLUCONAZOLE 150 MG PO TABS: 150.0000 mg | ORAL_TABLET | ORAL | 0 refills | Status: DC

## 2020-03-15 NOTE — ED Provider Notes (Signed)
[facility]   MRN: [id number] DOB: 19-Dec-1995  Subjective:   Suzanne Wolfe is a 24 y.o. female presenting for 3-day history of acute onset vaginal irritation, dysuria, urinary frequency, now having vaginal bleeding today.  Patient states that she has a history of UTIs, overactive bladder.  Drinks soda every day, only started drinking water in the past couple days as instructed by her regular doctor.  She did take Macrobid, leftover antibiotic from a previous infection.  She is sexually active, uses condoms for protection.  Has 1 partner.  Denies fever, nausea, vomiting, abdominal or pelvic pain, flank pain, vaginal discharge, genital rash.  No current facility-administered medications for this encounter.  Current Outpatient Medications:  .  acetaminophen (TYLENOL) 500 MG tablet, Take 500 mg by mouth every 6 (six) hours as needed., Disp: , Rfl:  .  ALBUTEROL IN, Inhale into the lungs., Disp: , Rfl:  .  ibuprofen (ADVIL) 800 MG tablet, Take 1 tablet (800 mg total) by mouth 3 (three) times daily., Disp: 21 tablet, Rfl: 0 .  norgestimate-ethinyl estradiol (ESTARYLLA) 0.25-35 MG-MCG tablet, Take 1 tablet by mouth every evening., Disp: , Rfl:  .  traMADol (ULTRAM) 50 MG tablet, Take 1 tablet (50 mg total) by mouth every 6 (six) hours as needed., Disp: 12 tablet, Rfl: 0   No Known Allergies  Past Medical History:  Diagnosis Date  . History of Clostridium difficile infection 02/ 2018  resolve w/ antibiotic  . Mild asthma   . Patellar instability of right knee      Past Surgical History:  Procedure Laterality Date  . COLONOSCOPY  08-04-2016  dr stark  . ESOPHAGOGASTRODUODENOSCOPY  06/20/2016  . LAPAROSCOPIC APPENDECTOMY  07/16/2009  . MEDIAL PATELLOFEMORAL LIGAMENT REPAIR Right 10/31/2016   Procedure: Right knee arthroscopic assisted medial patella femoral ligament reconstruction;  Surgeon: Yolonda Kida, MD;  Location: [facility];  Service:  Orthopedics;  Laterality: Right;  . PILONIDAL CYST EXCISION N/A 07/02/2014   Procedure: EXCISION OF PILONIDAL CYST WITH PRIMARY CLOSURE;  Surgeon: Judie Petit. Leonia Corona, MD;  Location: [facility];  Service: Pediatrics;  Laterality: N/A;  . PILONIDAL CYST EXCISION N/A 10/22/2014   Procedure: EXCISION PILONIDAL CYST AND SINUS;  Surgeon: Leonia Corona, MD;  Location: [facility];  Service: Pediatrics;  Laterality: N/A;  Re-excision  . WISDOM TOOTH EXTRACTION      Family History  Problem Relation Age of Onset  . Diabetes Maternal Grandmother   . Hypertension Maternal Grandmother   . Heart disease Maternal Grandmother        hx. open heart surgery  . Asthma Father   . Colon cancer Neg Hx   . Esophageal cancer Neg Hx     Social History   Tobacco Use  . Smoking status: Never Smoker  . Smokeless tobacco: Never Used  Vaping Use  . Vaping Use: Never used  Substance Use Topics  . Alcohol use: No  . Drug use: No    ROS   Objective:   Vitals: BP 117/81 (BP Location: Right Arm)   Pulse 100   Temp 99 F (37.2 C) (Oral)   Resp 18   LMP 02/26/2020 (Approximate)   SpO2 96%   Physical Exam Constitutional:      General: She is not in acute distress.    Appearance: Normal appearance. She is well-developed. She is not ill-appearing.  HENT:     Head: Normocephalic and atraumatic.     Nose: Nose  normal.     Mouth/Throat:     Mouth: Mucous membranes are moist.     Pharynx: Oropharynx is clear.  Eyes:     General: No scleral icterus.       Right eye: No discharge.        Left eye: No discharge.     Extraocular Movements: Extraocular movements intact.     Conjunctiva/sclera: Conjunctivae normal.     Pupils: Pupils are equal, round, and reactive to light.  Cardiovascular:     Rate and Rhythm: Normal rate.  Pulmonary:     Effort: Pulmonary effort is normal.  Abdominal:     General: Bowel sounds are normal. There is no distension.     Palpations: Abdomen  is soft. There is no mass.     Tenderness: There is no abdominal tenderness. There is no right CVA tenderness, left CVA tenderness, guarding or rebound.  Skin:    General: Skin is warm and dry.  Neurological:     General: No focal deficit present.     Mental Status: She is alert and oriented to person, place, and time.  Psychiatric:        Mood and Affect: Mood normal.        Behavior: Behavior normal.        Thought Content: Thought content normal.        Judgment: Judgment normal.     Results for orders placed or performed during the hospital encounter of 03/15/20 (from the past 24 hour(s))  POC Urinalysis dipstick     Status: Abnormal   Collection Time: 03/15/20  8:34 AM  Result Value Ref Range   Glucose, UA 100 (A) NEGATIVE mg/dL   Bilirubin Urine SMALL (A) NEGATIVE   Ketones, ur 40 (A) NEGATIVE mg/dL   Specific Gravity, Urine 1.020 1.005 - 1.030   Hgb urine dipstick MODERATE (A) NEGATIVE   pH 5.0 5.0 - 8.0   Protein, ur 30 (A) NEGATIVE mg/dL   Urobilinogen, UA 1.0 0.0 - 1.0 mg/dL   Nitrite POSITIVE (A) NEGATIVE   Leukocytes,Ua TRACE (A) NEGATIVE  POC urine pregnancy     Status: None   Collection Time: 03/15/20  8:43 AM  Result Value Ref Range   Preg Test, Ur NEGATIVE NEGATIVE    Assessment and Plan :   PDMP not reviewed this encounter.  1. Acute cystitis with hematuria   2. Dysuria   3. Vaginal bleeding     Start Bactrim to cover for acute cystitis, urine culture pending.  Cover for yeast vaginitis with Diflucan.  Recommended aggressive hydration, limiting urinary irritants. Counseled patient on potential for adverse effects with medications prescribed/recommended today, ER and return-to-clinic precautions discussed, patient verbalized understanding.    Wallis Bamberg, [place] 03/15/20 [phone]

## 2020-03-15 NOTE — Discharge Instructions (Signed)
Make sure you hydrate very well with plain water and a quantity of 64 ounces of water a day.  Please limit drinks that are considered urinary irritants such as soda, sweet tea, coffee, energy drinks, alcohol.  These can worsen your UTI symptoms and also be the source of them.  I will let you know about your urine culture results through MyChart to see if we need to change your antibiotics based off of those results. ° °

## 2020-03-15 NOTE — ED Triage Notes (Signed)
Pt in with c/o vaginal irritation and bleeding that started on Friday. States that she is having burning sensation.   States that she gets utis often and she had leftover antibiotics that she has been taking with no relief  Denies fever, discharge or abdominal pain

## 2020-03-16 LAB — URINE CULTURE: Culture: NO GROWTH

## 2020-03-16 LAB — CERVICOVAGINAL ANCILLARY ONLY
Bacterial Vaginitis (gardnerella): POSITIVE — AB
Candida Glabrata: NEGATIVE
Candida Vaginitis: NEGATIVE
Chlamydia: NEGATIVE
Comment: NEGATIVE
Comment: NEGATIVE
Comment: NEGATIVE
Comment: NEGATIVE
Comment: NEGATIVE
Comment: NORMAL
Neisseria Gonorrhea: NEGATIVE
Trichomonas: POSITIVE — AB

## 2020-03-17 ENCOUNTER — Telehealth (HOSPITAL_COMMUNITY): Payer: Self-pay | Admitting: Emergency Medicine

## 2020-03-17 MED ORDER — METRONIDAZOLE 500 MG PO TABS: 500.0000 mg | ORAL_TABLET | Freq: Two times a day (BID) | ORAL | 0 refills | Status: DC

## 2020-03-17 NOTE — Telephone Encounter (Signed)
Changed pharmacy for patient 

## 2020-06-08 ENCOUNTER — Other Ambulatory Visit: Payer: Self-pay

## 2020-06-08 ENCOUNTER — Inpatient Hospital Stay (HOSPITAL_COMMUNITY): Payer: 59

## 2020-06-08 ENCOUNTER — Inpatient Hospital Stay (HOSPITAL_COMMUNITY)
Admission: AD | Admit: 2020-06-08 | Discharge: 2020-06-08 | Disposition: A | Discharge status: Home / Self Care | Payer: 59 | Attending: Obstetrics & Gynecology | Admitting: Obstetrics & Gynecology

## 2020-06-08 ENCOUNTER — Encounter (HOSPITAL_COMMUNITY): Payer: Self-pay | Admitting: Obstetrics & Gynecology

## 2020-06-08 DIAGNOSIS — O2341 Unspecified infection of urinary tract in pregnancy, first trimester: Secondary | ICD-10-CM | POA: Diagnosis not present

## 2020-06-08 DIAGNOSIS — N760 Acute vaginitis: Secondary | ICD-10-CM

## 2020-06-08 DIAGNOSIS — O26891 Other specified pregnancy related conditions, first trimester: Secondary | ICD-10-CM | POA: Diagnosis present

## 2020-06-08 DIAGNOSIS — Z3A01 Less than 8 weeks gestation of pregnancy: Secondary | ICD-10-CM | POA: Diagnosis not present

## 2020-06-08 DIAGNOSIS — B9689 Other specified bacterial agents as the cause of diseases classified elsewhere: Secondary | ICD-10-CM

## 2020-06-08 DIAGNOSIS — M549 Dorsalgia, unspecified: Secondary | ICD-10-CM | POA: Diagnosis not present

## 2020-06-08 DIAGNOSIS — O4691 Antepartum hemorrhage, unspecified, first trimester: Secondary | ICD-10-CM | POA: Diagnosis not present

## 2020-06-08 DIAGNOSIS — R109 Unspecified abdominal pain: Secondary | ICD-10-CM

## 2020-06-08 DIAGNOSIS — O209 Hemorrhage in early pregnancy, unspecified: Secondary | ICD-10-CM

## 2020-06-08 DIAGNOSIS — Z79899 Other long term (current) drug therapy: Secondary | ICD-10-CM | POA: Diagnosis not present

## 2020-06-08 DIAGNOSIS — O4690 Antepartum hemorrhage, unspecified, unspecified trimester: Secondary | ICD-10-CM

## 2020-06-08 LAB — URINALYSIS, ROUTINE W REFLEX MICROSCOPIC
Bacteria, UA: NONE SEEN
Bilirubin Urine: NEGATIVE
Glucose, UA: NEGATIVE mg/dL
Hgb urine dipstick: NEGATIVE
Ketones, ur: 5 mg/dL — AB
Nitrite: POSITIVE — AB
Protein, ur: NEGATIVE mg/dL
Specific Gravity, Urine: 1.021 (ref 1.005–1.030)
pH: 5 (ref 5.0–8.0)

## 2020-06-08 LAB — CBC
HCT: 44.3 % (ref 36.0–46.0)
Hemoglobin: 14.3 g/dL (ref 12.0–15.0)
MCH: 26.9 pg (ref 26.0–34.0)
MCHC: 32.3 g/dL (ref 30.0–36.0)
MCV: 83.3 fL (ref 80.0–100.0)
Platelets: 268 10*3/uL (ref 150–400)
RBC: 5.32 MIL/uL — ABNORMAL HIGH (ref 3.87–5.11)
RDW: 13.7 % (ref 11.5–15.5)
WBC: 10.4 10*3/uL (ref 4.0–10.5)
nRBC: 0 % (ref 0.0–0.2)

## 2020-06-08 LAB — WET PREP, GENITAL
Sperm: NONE SEEN
Trich, Wet Prep: NONE SEEN
Yeast Wet Prep HPF POC: NONE SEEN

## 2020-06-08 LAB — ABO/RH: ABO/RH(D): A POS

## 2020-06-08 LAB — POCT PREGNANCY, URINE: Preg Test, Ur: POSITIVE — AB

## 2020-06-08 LAB — HCG, QUANTITATIVE, PREGNANCY: hCG, Beta Chain, Quant, S: 61982 m[IU]/mL — ABNORMAL HIGH (ref <5)

## 2020-06-08 MED ORDER — ACETAMINOPHEN 500 MG PO TABS
1000.0000 mg | ORAL_TABLET | Freq: Once | ORAL | Status: AC
  Administered 2020-06-08: 1000 mg via ORAL
  Filled 2020-06-08: qty 2

## 2020-06-08 MED ORDER — CEFADROXIL 500 MG PO CAPS: 500.0000 mg | ORAL_CAPSULE | Freq: Two times a day (BID) | ORAL | 0 refills | Status: DC

## 2020-06-08 MED ORDER — METRONIDAZOLE 500 MG PO TABS: 500.0000 mg | ORAL_TABLET | Freq: Two times a day (BID) | ORAL | 0 refills | Status: DC

## 2020-06-08 NOTE — MAU Note (Signed)
Found out she is about [redacted] weeks pregnant.  Started having bleeding since 1700 yesterday.  Also endorses abdominal cramping that comes and goes.  LMP 04/28/20

## 2020-06-08 NOTE — Discharge Instructions (Signed)
Medication Abortion, Care After The following information offers guidance on how to care for yourself after your procedure. Your health care provider may also give you more specific instructions. If you have problems or questions, contact your health care provider. What can I expect after the procedure? After the procedure, it is common to have:  Bleeding that lasts for a few hours or a few days. It may feel like you are having a heavy menstrual period.  Cramps that are similar to menstrual cramps.  A headache.  Diarrhea.  Nausea and vomiting.  Fever, chills, or hot flushes.  Dizziness. Your next menstrual period will most likely start 4-6 weeks after the procedure, unless you start taking birth control pills. Follow these instructions at home: Medicines  Take over-the-counter and prescription medicines only as told by your health care provider.  Only take the medicines your health care provider recommends. Do not take aspirin. It can cause bleeding. Activity  Do not have sex for 2-3 weeks or until your health care provider approves.  Rest and avoid activity that requires a lot of effort for 2-3 weeks. General instructions  Do not douche or use tampons until your health care provider approves.  Ask your health care provider when you can start using hormonal birth control.  Keep all follow-up visits. This is important.   Contact a health care provider if:  You have a fever of 100.4F (38C) or higher.  You have pain that is not relieved by over-the-counter pain medicine.  You have a bad-smelling vaginal discharge.  You have pain or bleeding that gets worse.  You have any of the following symptoms for more than 24 hours: ? Nausea. ? Vomiting. ? Diarrhea.  You need to change your pad more than once in an hour.  You do not have any bleeding after 3 days. This is a sign that the medicine may not be working. Get help right away if:  You have severe cramps in your  stomach, back, or abdomen.  You become light-headed, weak, or faint. Summary  After the procedure, it is common to have bleeding, headache, diarrhea, nausea and vomiting, chills, and dizziness.  Take over-the-counter and prescription medicines only as told by your health care provider.  Do not have sex until your health care provider approves.  Keep all follow-up visits. This is important. This information is not intended to replace advice given to you by your health care provider. Make sure you discuss any questions you have with your health care provider. Document Revised: 02/19/2020 Document Reviewed: 02/19/2020 Elsevier Patient Education  2021 Elsevier Inc.  

## 2020-06-08 NOTE — MAU Provider Note (Signed)
History     CSN: [id number]  Arrival date and time: 06/08/20 1356   Event Date/Time   First Provider Initiated Contact with Patient 06/08/20 1516      Chief Complaint  Patient presents with  . Abdominal Pain  . Back Pain  . Vaginal Bleeding   Suzanne Wolfe is a 25 y.o. G1P0 at [redacted]w[redacted]d by Definite LMP.  She presents today for Abdominal Pain, Back Pain, and Vaginal Bleeding.  Patient states her abdominal pain is intermittent, but when it occurs it is about an 8/10.  She states it is located on her left side and lasts one minute.  She states her back pain has been constant and is also an 8/10. Patient states she has not taken anything for the pain. She states she started bleeding yesterday and was having dark brown discharge prior to the bleeding.  Patient states the bleeding is not heavy, but "frequent." She states she does wear a pad. She denies recent sexual activity or pain/difficulty with urination.   OB History    Gravida  1   Para      Term      Preterm      AB      Living        SAB      IAB      Ectopic      Multiple      Live Births              Past Medical History:  Diagnosis Date  . History of Clostridium difficile infection 02/ 2018  resolve w/ antibiotic  . Mild asthma   . Patellar instability of right knee     Past Surgical History:  Procedure Laterality Date  . COLONOSCOPY  08-04-2016  dr stark  . ESOPHAGOGASTRODUODENOSCOPY  06/20/2016  . LAPAROSCOPIC APPENDECTOMY  07/16/2009  . MEDIAL PATELLOFEMORAL LIGAMENT REPAIR Right 10/31/2016   Procedure: Right knee arthroscopic assisted medial patella femoral ligament reconstruction;  Surgeon: Yolonda Kida, MD;  Location: [facility];  Service: Orthopedics;  Laterality: Right;  . PILONIDAL CYST EXCISION N/A 07/02/2014   Procedure: EXCISION OF PILONIDAL CYST WITH PRIMARY CLOSURE;  Surgeon: Judie Petit. Leonia Corona, MD;  Location: [facility];  Service: Pediatrics;   Laterality: N/A;  . PILONIDAL CYST EXCISION N/A 10/22/2014   Procedure: EXCISION PILONIDAL CYST AND SINUS;  Surgeon: Leonia Corona, MD;  Location: [facility];  Service: Pediatrics;  Laterality: N/A;  Re-excision  . WISDOM TOOTH EXTRACTION      Family History  Problem Relation Age of Onset  . Diabetes Maternal Grandmother   . Hypertension Maternal Grandmother   . Heart disease Maternal Grandmother        hx. open heart surgery  . Asthma Father   . Colon cancer Neg Hx   . Esophageal cancer Neg Hx     Social History   Tobacco Use  . Smoking status: Never Smoker  . Smokeless tobacco: Never Used  Vaping Use  . Vaping Use: Never used  Substance Use Topics  . Alcohol use: No  . Drug use: No    Allergies: No Known Allergies  Medications Prior to Admission  Medication Sig Dispense Refill Last Dose  . acetaminophen (TYLENOL) 500 MG tablet Take 500 mg by mouth every 6 (six) hours as needed.     . ALBUTEROL IN Inhale into the lungs.     . fluconazole (DIFLUCAN) 150 MG tablet Take 1 tablet (150 mg total)  by mouth once a week. 2 tablet 0   . ibuprofen (ADVIL) 800 MG tablet Take 1 tablet (800 mg total) by mouth 3 (three) times daily. 21 tablet 0   . metroNIDAZOLE (FLAGYL) 500 MG tablet Take 1 tablet (500 mg total) by mouth 2 (two) times daily. 14 tablet 0   . norgestimate-ethinyl estradiol (ESTARYLLA) 0.25-35 MG-MCG tablet Take 1 tablet by mouth every evening.     . sulfamethoxazole-trimethoprim (BACTRIM DS) 800-160 MG tablet Take 1 tablet by mouth 2 (two) times daily. 10 tablet 0   . traMADol (ULTRAM) 50 MG tablet Take 1 tablet (50 mg total) by mouth every 6 (six) hours as needed. 12 tablet 0     Review of Systems  Gastrointestinal: Positive for nausea. Negative for abdominal pain and vomiting.  Genitourinary: Positive for vaginal bleeding and vaginal discharge. Negative for difficulty urinating and dysuria.  Musculoskeletal: Positive for back pain.  Neurological:  Negative for dizziness, light-headedness and headaches.   Physical Exam   Blood pressure 117/61, pulse 79, temperature 99.5 F (37.5 C), resp. rate 17, weight 71.8 kg, last menstrual period 04/28/2020, SpO2 99 %.  Physical Exam Constitutional:      Appearance: She is well-developed.  HENT:     Head: Normocephalic and atraumatic.  Eyes:     Conjunctiva/sclera: Conjunctivae normal.  Cardiovascular:     Rate and Rhythm: Normal rate.  Pulmonary:     Effort: Pulmonary effort is normal. No respiratory distress.  Musculoskeletal:        General: Normal range of motion.     Cervical back: Normal range of motion.  Skin:    General: Skin is warm and dry.  Neurological:     Mental Status: She is alert and oriented to person, place, and time.  Psychiatric:        Mood and Affect: Mood normal.        Behavior: Behavior normal.        Thought Content: Thought content normal.     MAU Course  Procedures Results for orders placed or performed during the hospital encounter of 06/08/20 (from the past 24 hour(s))  Urinalysis, Routine w reflex microscopic Urine, Clean Catch     Status: Abnormal   Collection Time: 06/08/20  2:24 PM  Result Value Ref Range   Color, Urine YELLOW YELLOW   APPearance HAZY (A) CLEAR   Specific Gravity, Urine 1.021 1.005 - 1.030   pH 5.0 5.0 - 8.0   Glucose, UA NEGATIVE NEGATIVE mg/dL   Hgb urine dipstick NEGATIVE NEGATIVE   Bilirubin Urine NEGATIVE NEGATIVE   Ketones, ur 5 (A) NEGATIVE mg/dL   Protein, ur NEGATIVE NEGATIVE mg/dL   Nitrite POSITIVE (A) NEGATIVE   Leukocytes,Ua TRACE (A) NEGATIVE   RBC / HPF 0-5 0 - 5 RBC/hpf   WBC, UA 6-10 0 - 5 WBC/hpf   Bacteria, UA NONE SEEN NONE SEEN   Squamous Epithelial / LPF 0-5 0 - 5   Mucus PRESENT   Pregnancy, urine POC     Status: Abnormal   Collection Time: 06/08/20  2:45 PM  Result Value Ref Range   Preg Test, Ur POSITIVE (A) NEGATIVE  CBC     Status: Abnormal   Collection Time: 06/08/20  3:20 PM  Result  Value Ref Range   WBC 10.4 4.0 - 10.5 K/uL   RBC 5.32 (H) 3.87 - 5.11 MIL/uL   Hemoglobin 14.3 12.0 - 15.0 g/dL   HCT 40.9 81.1 - 91.4 %   MCV  83.3 80.0 - 100.0 fL   MCH 26.9 26.0 - 34.0 pg   MCHC 32.3 30.0 - 36.0 g/dL   RDW 69.613.7 29.511.5 - 28.415.5 %   Platelets 268 150 - 400 K/uL   nRBC 0.0 0.0 - 0.2 %  ABO/Rh     Status: None   Collection Time: 06/08/20  3:20 PM  Result Value Ref Range   ABO/RH(D) A POS    No rh immune globuloin      NOT A RH IMMUNE GLOBULIN CANDIDATE, PT RH POSITIVE Performed at [facility], [address] [place], [place] [address]   hCG, quantitative, pregnancy     Status: Abnormal   Collection Time: 06/08/20  3:20 PM  Result Value Ref Range   hCG, Beta Chain, Mahalia LongestQuant, S [id number] (H) <5 mIU/mL  Wet prep, genital     Status: Abnormal   Collection Time: 06/08/20  3:34 PM   Specimen: Vaginal  Result Value Ref Range   Yeast Wet Prep HPF POC NONE SEEN NONE SEEN   Trich, Wet Prep NONE SEEN NONE SEEN   Clue Cells Wet Prep HPF POC PRESENT (A) NONE SEEN   WBC, Wet Prep HPF POC MODERATE (A) NONE SEEN   Sperm NONE SEEN    US OB LESS THAN 14 WEEKS WITH OB TRANSVAGINAL  Result Date: 06/08/2020 CLINICAL DATA:  Vaginal bleeding EXAM: OBSTETRIC <14 WK US AND TRANSVAGINAL OB US TECHNIQUE: Both transabdominal and transvaginal ultrasound examinations were performed for complete evaluation of the gestation as well as the maternal uterus, adnexal regions, and pelvic cul-de-sac. Transvaginal technique was performed to assess early pregnancy. COMPARISON:  None. FINDINGS: Intrauterine gestational sac: Single intrauterine gestational sac Yolk sac:  Visualized Embryo:  Visualized Cardiac Activity: Visualized Heart Rate: 104 bpm CRL: 2.9 mm   5 w   5 d                  US EDC: 02/03/2021 Subchorionic hemorrhage:  None visualized. Maternal uterus/adnexae: Ovaries are within normal limits. Left ovary measures 1.8 x 1.4 x 1.8 cm. The right ovary measures 2.4 by 3.1 x 2.5 cm and contains  corpus luteum. No significant free fluid IMPRESSION: 1. Single viable intrauterine pregnancy as above. 2. No specific abnormality is seen Electronically Signed   By: Jasmine PangKim  Fujinaga M.D.   On: 06/08/2020 16:22   MDM Wet Prep and GC/CT Labs: UA, UPT, CBC, hCG, ABO Ultrasound Pain Medication Assessment and Plan  [age] year old  G1P0 at 5.6 weeks Vaginal Bleeding Back Pain Abdominal Pain  -POC Reviewed. -Patient to collect swabs. -Patient offered and accepts pain medication. -Will start with tylenol and reassess. -Will send for US and await results.  Cherre RobinsJessica L Natanel Snavely 06/08/2020, 3:16 PM   Reassessment (5:25 PM) SIUP at 5.5 weeks Bacterial Vaginosis UTI  -Results return as above. -Patient and SO informed of findings and need for treatment. -Reassured that IUP identified and no source of bleeding noted. -Patient reports she is planning to terminate pregnancy and wanted to rule out miscarriage. -Informed that heartbeat noted and no current signs of definitive miscarriage.  -Patient reports back pain has resolved with tylenol dosing, but intermittent abdominal pain remains. -Discussed round ligament pain. -Informed of need for UTI treatment today.  Discussed how urine would be sent for culture and if additional or change in medications necessary, patient would be contacted via phone or mychart. -Rx for Duricef and Metronidazole sent to pharmacy on file. -Encouraged to call or return to MAU  if symptoms worsen or with the onset of new symptoms. -Discharged to home in stable condition.  Cherre Robins MSN, CNM Advanced Practice Provider, [facility] [employer]

## 2020-06-09 LAB — GC/CHLAMYDIA PROBE AMP (~~LOC~~) NOT AT ARMC
Chlamydia: NEGATIVE
Comment: NEGATIVE
Comment: NORMAL
Neisseria Gonorrhea: NEGATIVE

## 2020-06-11 ENCOUNTER — Other Ambulatory Visit: Payer: Self-pay

## 2020-06-11 DIAGNOSIS — B9620 Unspecified Escherichia coli [E. coli] as the cause of diseases classified elsewhere: Secondary | ICD-10-CM

## 2020-06-11 LAB — CULTURE, OB URINE: Culture: >=100000 — AB

## 2020-06-11 MED ORDER — NITROFURANTOIN MONOHYD MACRO 100 MG PO CAPS: 100.0000 mg | ORAL_CAPSULE | Freq: Two times a day (BID) | ORAL | 0 refills | Status: DC

## 2020-06-15 ENCOUNTER — Other Ambulatory Visit: Payer: Self-pay

## 2020-06-15 ENCOUNTER — Encounter (HOSPITAL_COMMUNITY): Payer: Self-pay

## 2020-06-15 ENCOUNTER — Ambulatory Visit (HOSPITAL_COMMUNITY)
Admission: EM | Admit: 2020-06-15 | Discharge: 2020-06-15 | Disposition: A | Discharge status: Home / Self Care | Payer: 59 | Attending: Emergency Medicine | Admitting: Emergency Medicine

## 2020-06-15 DIAGNOSIS — N760 Acute vaginitis: Secondary | ICD-10-CM | POA: Insufficient documentation

## 2020-06-15 MED ORDER — FLUCONAZOLE 150 MG PO TABS: 150.0000 mg | ORAL_TABLET | Freq: Once | ORAL | 1 refills | Status: AC

## 2020-06-15 NOTE — Discharge Instructions (Addendum)
I suspect that this is a yeast infection.  Diflucan, continue Monistat external cream.  Sitz baths, especially when you need to urinate.  This will ease the pain with urination.

## 2020-06-15 NOTE — ED Triage Notes (Signed)
Pt c/o vaginal irritation and swelling x 2 days. Pt states she has a UTI an is taking medicine. Pt states her provider told her the medicine can cause vaginal irritation. Pt states she bought monistat to relieve the sxs. She states monistat did not help, she states she soaked in the bath with warm water and it relieved the irritation.

## 2020-06-15 NOTE — ED Provider Notes (Signed)
HPI  SUBJECTIVE:  Suzanne Wolfe is a 25 y.o. female who presents with vaginal irritation and itching along the bilateral inner labia starting yesterday.  She reports erythema, labial swelling.  No genital rash, peeling, vaginal odor, discharge, bleeding.  No lip or tongue swelling, peeling of the lips or hands, rash, hives, shortness of breath.  She is currently on day number 4 out of 7 of cefadroxil for an E. coli UTI prescribed to her by OB/GYN.  She started off on Macrobid and was switched to cefadroxil.  She states that she was also on metronidazole for 2 days recently, she discontinued this on 1/28.  She tried Monistat last night, sitz baths, warm compresses.  Sitz bath and warm compresses help.  Symptoms are worse with sitting and movement.  She has never had problems with cefadroxil before.  She denies using perfumed soaps or body washes.  She states that she has not been sexually active since she had negative STD testing last week.  Patient with a past medical history of E. coli UTI that was sensitive to Macrobid and Bactrim, resistant to cephalosporins. Also h/o gonorrhea, chlamydia, trichomonas BV, yeast infections, C. Difficile.  No history of pyelonephritis, nephrolithiasis, HIV, HSV, syphilis, diabetes, Trudie Buckler syndrome.  She is a G1, P0.  She is [redacted] weeks pregnant.  She saw OB/GYN for this on 1/25.  She states that she is not planning on keeping it.  She has a scheduled TOP in several days.  [facility]:ENIDP, Christell Constant, MD   States she frequently gets yeast infections with urinary tract infection  Past Medical History:  Diagnosis Date  . History of Clostridium difficile infection 02/ 2018  resolve w/ antibiotic  . Mild asthma   . Patellar instability of right knee     Past Surgical History:  Procedure Laterality Date  . COLONOSCOPY  08-04-2016  dr stark  . ESOPHAGOGASTRODUODENOSCOPY  06/20/2016  . LAPAROSCOPIC APPENDECTOMY  07/16/2009  . MEDIAL PATELLOFEMORAL LIGAMENT REPAIR Right  10/31/2016   Procedure: Right knee arthroscopic assisted medial patella femoral ligament reconstruction;  Surgeon: Yolonda Kida, MD;  Location: [facility];  Service: Orthopedics;  Laterality: Right;  . PILONIDAL CYST EXCISION N/A 07/02/2014   Procedure: EXCISION OF PILONIDAL CYST WITH PRIMARY CLOSURE;  Surgeon: Judie Petit. Leonia Corona, MD;  Location: [facility];  Service: Pediatrics;  Laterality: N/A;  . PILONIDAL CYST EXCISION N/A 10/22/2014   Procedure: EXCISION PILONIDAL CYST AND SINUS;  Surgeon: Leonia Corona, MD;  Location: [facility];  Service: Pediatrics;  Laterality: N/A;  Re-excision  . WISDOM TOOTH EXTRACTION      Family History  Problem Relation Age of Onset  . Diabetes Maternal Grandmother   . Hypertension Maternal Grandmother   . Heart disease Maternal Grandmother        hx. open heart surgery  . Asthma Father   . Colon cancer Neg Hx   . Esophageal cancer Neg Hx     Social History   Tobacco Use  . Smoking status: Never Smoker  . Smokeless tobacco: Never Used  Vaping Use  . Vaping Use: Never used  Substance Use Topics  . Alcohol use: No  . Drug use: No    No current facility-administered medications for this encounter.  Current Outpatient Medications:  .  fluconazole (DIFLUCAN) 150 MG tablet, Take 1 tablet (150 mg total) by mouth once for 1 dose. 1 tab po x 1. May repeat in 72 hours if no improvement, Disp: 2  tablet, Rfl: 1 .  acetaminophen (TYLENOL) 500 MG tablet, Take 500 mg by mouth every 6 (six) hours as needed., Disp: , Rfl:  .  ALBUTEROL IN, Inhale into the lungs., Disp: , Rfl:   No Known Allergies   ROS  As noted in HPI.   Physical Exam  BP 102/72 (BP Location: Right Arm)   Pulse 90   Temp 98.9 F (37.2 C) (Oral)   Resp 18   LMP 04/28/2020 (Exact Date)   SpO2 98%   Constitutional: Well developed, well nourished, no acute distress Eyes:  EOMI, conjunctiva normal bilaterally HENT: Normocephalic,  atraumatic,mucus membranes moist Respiratory: Normal inspiratory effort Cardiovascular: Normal rate GI: nondistended GU: External labia normal.  Inner labia/introitus erythematous, irritated.  No ulcers noted.  No vulvar or labial swelling.  Extensive white material which patient states is Monistat.  Chaperone present during exam. skin: No rash, skin intact Musculoskeletal: no deformities Neurologic: Alert & oriented x 3, no focal neuro deficits Psychiatric: Speech and behavior appropriate   ED Course   Medications - No data to display  No orders of the defined types were placed in this encounter.   No results found for this or any previous visit (from the past 24 hour(s)). No results found.  ED Clinical Impression  1. Vaginitis and vulvovaginitis      ED Assessment/Plan  Previous labs reviewed.  As noted in HPI.  I think that we need to do STD testing as she had negative STD testing last week and she has not been sexually active since.  No evidence of herpes, Stevens-Johnson syndrome, allergic reaction.  Suspect yeast from the antibiotics.  Will send home with Diflucan, she is to continue Monistat external cream and to continue her cefadroxil for UTI.  Will check for BV and yeast.  Sitz baths.  Work note.  Follow-up with PMD as needed  Discussed labs,  MDM, treatment plan, and plan for follow-up with patient. patient agrees with plan.   Meds ordered this encounter  Medications  . fluconazole (DIFLUCAN) 150 MG tablet    Sig: Take 1 tablet (150 mg total) by mouth once for 1 dose. 1 tab po x 1. May repeat in 72 hours if no improvement    Dispense:  2 tablet    Refill:  1    *This clinic note was created using Scientist, clinical (histocompatibility and immunogenetics). Therefore, there may be occasional mistakes despite careful proofreading.   ?    Domenick Gong, MD 06/15/20 1254

## 2020-06-16 LAB — CERVICOVAGINAL ANCILLARY ONLY
Bacterial Vaginitis (gardnerella): NEGATIVE
Candida Glabrata: NEGATIVE
Candida Vaginitis: POSITIVE — AB
Comment: NEGATIVE
Comment: NEGATIVE
Comment: NEGATIVE

## 2021-03-17 ENCOUNTER — Encounter (HOSPITAL_COMMUNITY): Payer: Self-pay | Admitting: Emergency Medicine

## 2021-03-17 ENCOUNTER — Ambulatory Visit (HOSPITAL_COMMUNITY)
Admission: EM | Admit: 2021-03-17 | Discharge: 2021-03-17 | Disposition: A | Discharge status: Home / Self Care | Payer: 59 | Attending: Emergency Medicine | Admitting: Emergency Medicine

## 2021-03-17 ENCOUNTER — Other Ambulatory Visit: Payer: Self-pay

## 2021-03-17 DIAGNOSIS — N390 Urinary tract infection, site not specified: Secondary | ICD-10-CM | POA: Diagnosis present

## 2021-03-17 DIAGNOSIS — N898 Other specified noninflammatory disorders of vagina: Secondary | ICD-10-CM | POA: Insufficient documentation

## 2021-03-17 LAB — POCT URINALYSIS DIPSTICK, ED / UC
Bilirubin Urine: NEGATIVE
Glucose, UA: NEGATIVE mg/dL
Ketones, ur: NEGATIVE mg/dL
Nitrite: POSITIVE — AB
Protein, ur: NEGATIVE mg/dL
Specific Gravity, Urine: >=1.03 (ref 1.005–1.030)
Urobilinogen, UA: 0.2 mg/dL (ref 0.0–1.0)
pH: 5.5 (ref 5.0–8.0)

## 2021-03-17 LAB — POC URINE PREG, ED: Preg Test, Ur: NEGATIVE

## 2021-03-17 MED ORDER — SULFAMETHOXAZOLE-TRIMETHOPRIM 800-160 MG PO TABS: 1.0000 | ORAL_TABLET | Freq: Two times a day (BID) | ORAL | 0 refills | Status: AC

## 2021-03-17 NOTE — ED Provider Notes (Signed)
[facility]    CSN: [id number] Arrival date & time: 03/17/21  1918      History   Chief Complaint Chief Complaint  Patient presents with   Back Pain   Dysuria   Urinary Frequency    HPI Suzanne Wolfe is a 25 y.o. female.   Patient comes planes of please frequency of urination and burning with urination over the past 4 days, states today her symptoms became unbearable so she came in to be evaluated for urinary tract infection.  Patient also complains of thin white vaginal discharge that started roughly the same time however noticed today that the discharge has become more yellow, states that discharge does not have a foul odor.  Patient also complains of right flank pain, present for 2 days.  Patient denies fever, aches, chills, nausea, vomiting, diarrhea.  The history is provided by the patient.   Past Medical History:  Diagnosis Date   History of Clostridium difficile infection 02/ 2018  resolve w/ antibiotic   Mild asthma    Patellar instability of right knee     Patient Active Problem List   Diagnosis Date Noted   Patellar instability of right knee 10/31/2016   Infected pilonidal cyst 10/22/2014   Pilonidal sinus 07/02/2014   Episodic tension-type headache, not intractable 12/04/2013   Migraine without aura and without status migrainosus, not intractable 12/04/2013    Past Surgical History:  Procedure Laterality Date   COLONOSCOPY  08-04-2016  dr stark   ESOPHAGOGASTRODUODENOSCOPY  06/20/2016   LAPAROSCOPIC APPENDECTOMY  07/16/2009   MEDIAL PATELLOFEMORAL LIGAMENT REPAIR Right 10/31/2016   Procedure: Right knee arthroscopic assisted medial patella femoral ligament reconstruction;  Surgeon: Yolonda Kida, MD;  Location: [facility];  Service: Orthopedics;  Laterality: Right;   PILONIDAL CYST EXCISION N/A 07/02/2014   Procedure: EXCISION OF PILONIDAL CYST WITH PRIMARY CLOSURE;  Surgeon: Judie Petit. Leonia Corona, MD;  Location: [facility];  Service: Pediatrics;  Laterality: N/A;   PILONIDAL CYST EXCISION N/A 10/22/2014   Procedure: EXCISION PILONIDAL CYST AND SINUS;  Surgeon: Leonia Corona, MD;  Location: [facility];  Service: Pediatrics;  Laterality: N/A;  Re-excision   WISDOM TOOTH EXTRACTION      OB History     Gravida  1   Para      Term      Preterm      AB      Living         SAB      IAB      Ectopic      Multiple      Live Births               Home Medications    Prior to Admission medications   Medication Sig Start Date End Date Taking? Authorizing Provider  sulfamethoxazole-trimethoprim (BACTRIM DS) 800-160 MG tablet Take 1 tablet by mouth 2 (two) times daily for 5 days. 03/17/21 03/22/21 Yes Theadora Rama Scales, PA-C  acetaminophen (TYLENOL) 500 MG tablet Take 500 mg by mouth every 6 (six) hours as needed.    [provider]  ALBUTEROL IN Inhale into the lungs.    [provider]    Family History Family History  Problem Relation Age of Onset   Diabetes Maternal Grandmother    Hypertension Maternal Grandmother    Heart disease Maternal Grandmother        hx. open heart surgery   Asthma Father    Colon  cancer Neg Hx    Esophageal cancer Neg Hx     Social History Social History   Tobacco Use   Smoking status: Never   Smokeless tobacco: Never  Vaping Use   Vaping Use: Never used  Substance Use Topics   Alcohol use: No   Drug use: No     Allergies   Patient has no known allergies.   Review of Systems Review of Systems Pertinent findings noted in history of present illness.    Physical Exam Triage Vital Signs ED Triage Vitals  Enc Vitals Group     BP 03/11/21 0827 (!) 147/82     Pulse Rate 03/11/21 0827 72     Resp 03/11/21 0827 18     Temp 03/11/21 0827 98.3 F (36.8 C)     Temp Source 03/11/21 0827 Oral     SpO2 03/11/21 0827 98 %     Weight --      Height --      Head Circumference --      Peak Flow  --      Pain Score 03/11/21 0826 5     Pain Loc --      Pain Edu? --      Excl. in [place]? --    No data found.  Updated Vital Signs BP 124/80   Pulse 90   Temp 98 F (36.7 C)   Resp 18   LMP 03/03/2021 (Exact Date)   SpO2 100%   Breastfeeding No   Visual Acuity Right Eye Distance:   Left Eye Distance:   Bilateral Distance:    Right Eye Near:   Left Eye Near:    Bilateral Near:     Physical Exam Vitals and nursing note reviewed.  Constitutional:      General: She is not in acute distress.    Appearance: Normal appearance. She is not ill-appearing.  HENT:     Head: Normocephalic and atraumatic.  Eyes:     General: Lids are normal.        Right eye: No discharge.        Left eye: No discharge.     Extraocular Movements: Extraocular movements intact.     Conjunctiva/sclera: Conjunctivae normal.     Right eye: Right conjunctiva is not injected.     Left eye: Left conjunctiva is not injected.  Neck:     Trachea: Trachea and phonation normal.  Cardiovascular:     Rate and Rhythm: Normal rate and regular rhythm.     Pulses: Normal pulses.     Heart sounds: Normal heart sounds. No murmur heard.   No friction rub. No gallop.  Pulmonary:     Effort: Pulmonary effort is normal. No accessory muscle usage, prolonged expiration or respiratory distress.     Breath sounds: Normal breath sounds. No stridor, decreased air movement or transmitted upper airway sounds. No decreased breath sounds, wheezing, rhonchi or rales.  Chest:     Chest wall: No tenderness.  Abdominal:     General: Abdomen is flat. Bowel sounds are normal. There is no distension.     Palpations: Abdomen is soft.     Tenderness: There is abdominal tenderness in the suprapubic area. There is right CVA tenderness. There is no left CVA tenderness.     Hernia: No hernia is present.  Musculoskeletal:        General: Normal range of motion.     Cervical back: Normal range of motion and neck supple. Normal  range of  motion.  Lymphadenopathy:     Cervical: No cervical adenopathy.  Skin:    General: Skin is warm and dry.     Findings: No erythema or rash.  Neurological:     General: No focal deficit present.     Mental Status: She is alert and oriented to person, place, and time.  Psychiatric:        Mood and Affect: Mood normal.        Behavior: Behavior normal.     UC Treatments / Results  Labs (all labs ordered are listed, but only abnormal results are displayed) Labs Reviewed  POCT URINALYSIS DIPSTICK, ED / UC - Abnormal; Notable for the following components:      Result Value   Hgb urine dipstick SMALL (*)    Nitrite POSITIVE (*)    Leukocytes,Ua TRACE (*)    All other components within normal limits  URINE CULTURE  POC URINE PREG, ED  CERVICOVAGINAL ANCILLARY ONLY    EKG   Radiology No results found.  Procedures Procedures (including critical care time)  Medications Ordered in UC Medications - No data to display  Initial Impression / Assessment and Plan / UC Course  I have reviewed the triage vital signs and the nursing notes.  Pertinent labs & imaging results that were available during my care of the patient were reviewed by me and considered in my medical decision making (see chart for details).     Acute upper urinary tract infection, patient provided with a prescription for Bactrim x5 days, urine sent for culture.  Antibiotics will be adjusted as needed.  STI screening performed, patient advised she will be notified of results once received and if treatment is required this will be provided as well.  Patient verbalized understanding and agreement of plan as discussed.  All questions were addressed during visit.  Please see discharge instructions below for further details of plan.  Final Clinical Impressions(s) / UC Diagnoses   Final diagnoses:  Lower urinary tract infectious disease  Vaginal discharge     Discharge Instructions      Your urinalysis today  was positive for bacterial urinary tract infection.  I recommend that you begin taking Bactrim, 1 tablet twice daily for the next 5 days.  Your urine sample will be sent for culture, you will be notified of the results of the culture once received.  If if your antibiotic regimen needs to be changed based on results of your culture, new prescription will be provided for you.     ED Prescriptions     Medication Sig Dispense Auth. Provider   sulfamethoxazole-trimethoprim (BACTRIM DS) 800-160 MG tablet Take 1 tablet by mouth 2 (two) times daily for 5 days. 10 tablet Lynden Oxford Scales, PA-C      PDMP not reviewed this encounter.    Lynden Oxford Scales, PA-C 03/17/21 2116

## 2021-03-17 NOTE — ED Triage Notes (Signed)
Pt is present today with dysuria, urinary frequency, and lower back pain. Pt states sx started yesterday.

## 2021-03-17 NOTE — Discharge Instructions (Signed)
Your urinalysis today was positive for bacterial urinary tract infection.  I recommend that you begin taking Bactrim, 1 tablet twice daily for the next 5 days.  Your urine sample will be sent for culture, you will be notified of the results of the culture once received.  If if your antibiotic regimen needs to be changed based on results of your culture, new prescription will be provided for you.

## 2021-03-18 ENCOUNTER — Telehealth (HOSPITAL_COMMUNITY): Payer: Self-pay | Admitting: Emergency Medicine

## 2021-03-18 LAB — CERVICOVAGINAL ANCILLARY ONLY
Bacterial Vaginitis (gardnerella): POSITIVE — AB
Candida Glabrata: NEGATIVE
Candida Vaginitis: POSITIVE — AB
Chlamydia: NEGATIVE
Comment: NEGATIVE
Comment: NEGATIVE
Comment: NEGATIVE
Comment: NEGATIVE
Comment: NEGATIVE
Comment: NORMAL
Neisseria Gonorrhea: NEGATIVE
Trichomonas: NEGATIVE

## 2021-03-18 MED ORDER — FLUCONAZOLE 150 MG PO TABS: 150.0000 mg | ORAL_TABLET | Freq: Once | ORAL | 0 refills | Status: AC

## 2021-03-18 MED ORDER — METRONIDAZOLE 500 MG PO TABS: 500.0000 mg | ORAL_TABLET | Freq: Two times a day (BID) | ORAL | 0 refills | Status: DC

## 2021-03-19 LAB — URINE CULTURE: Culture: >=100000 — AB

## 2021-04-29 ENCOUNTER — Encounter (HOSPITAL_COMMUNITY): Payer: Self-pay

## 2021-04-29 ENCOUNTER — Other Ambulatory Visit: Payer: Self-pay

## 2021-04-29 ENCOUNTER — Ambulatory Visit (HOSPITAL_COMMUNITY)
Admission: EM | Admit: 2021-04-29 | Discharge: 2021-04-29 | Disposition: A | Discharge status: Home / Self Care | Payer: 59 | Attending: Emergency Medicine | Admitting: Emergency Medicine

## 2021-04-29 DIAGNOSIS — N898 Other specified noninflammatory disorders of vagina: Secondary | ICD-10-CM | POA: Insufficient documentation

## 2021-04-29 MED ORDER — FLUCONAZOLE 150 MG PO TABS: 150.0000 mg | ORAL_TABLET | Freq: Every day | ORAL | 0 refills | Status: AC

## 2021-04-29 NOTE — ED Triage Notes (Signed)
Pt presents to the office c/o itching,creamy vaginal discharge x 2-3 days.

## 2021-04-29 NOTE — ED Provider Notes (Signed)
[facility]    CSN: [id number] Arrival date & time: 04/29/21  1614      History   Chief Complaint Chief Complaint  Patient presents with   Vaginal Discharge    HPI Suzanne Wolfe is a 25 y.o. female.   Patient presents with Burnadette Baskett thick discharge for 3 days. Endorses vaginal irritation and slight dysuria.  Denies abdominal pain, flank pain, urinary frequency, urgency, rash or lesions, hematuria.  Sexually active, 1 partner, no condom use.  Patient endorses that she used to frequently get yeast infections related to scented soap, recently began using scented exfoliation scrub.    Past Medical History:  Diagnosis Date   History of Clostridium difficile infection 02/ 2018  resolve w/ antibiotic   Mild asthma    Patellar instability of right knee     Patient Active Problem List   Diagnosis Date Noted   Patellar instability of right knee 10/31/2016   Infected pilonidal cyst 10/22/2014   Pilonidal sinus 07/02/2014   Episodic tension-type headache, not intractable 12/04/2013   Migraine without aura and without status migrainosus, not intractable 12/04/2013    Past Surgical History:  Procedure Laterality Date   COLONOSCOPY  08-04-2016  dr stark   ESOPHAGOGASTRODUODENOSCOPY  06/20/2016   LAPAROSCOPIC APPENDECTOMY  07/16/2009   MEDIAL PATELLOFEMORAL LIGAMENT REPAIR Right 10/31/2016   Procedure: Right knee arthroscopic assisted medial patella femoral ligament reconstruction;  Surgeon: Yolonda Kida, MD;  Location: [facility];  Service: Orthopedics;  Laterality: Right;   PILONIDAL CYST EXCISION N/A 07/02/2014   Procedure: EXCISION OF PILONIDAL CYST WITH PRIMARY CLOSURE;  Surgeon: Judie Petit. Leonia Corona, MD;  Location: [facility];  Service: Pediatrics;  Laterality: N/A;   PILONIDAL CYST EXCISION N/A 10/22/2014   Procedure: EXCISION PILONIDAL CYST AND SINUS;  Surgeon: Leonia Corona, MD;  Location: [facility];  Service:  Pediatrics;  Laterality: N/A;  Re-excision   WISDOM TOOTH EXTRACTION      OB History     Gravida  1   Para      Term      Preterm      AB      Living         SAB      IAB      Ectopic      Multiple      Live Births               Home Medications    Prior to Admission medications   Medication Sig Start Date End Date Taking? Authorizing Provider  acetaminophen (TYLENOL) 500 MG tablet Take 500 mg by mouth every 6 (six) hours as needed.    [provider]  ALBUTEROL IN Inhale into the lungs.    [provider]  metroNIDAZOLE (FLAGYL) 500 MG tablet Take 1 tablet (500 mg total) by mouth 2 (two) times daily. 03/18/21   LampteyBritta Mccreedy, MD    Family History Family History  Problem Relation Age of Onset   Diabetes Maternal Grandmother    Hypertension Maternal Grandmother    Heart disease Maternal Grandmother        hx. open heart surgery   Asthma Father    Colon cancer Neg Hx    Esophageal cancer Neg Hx     Social History Social History   Tobacco Use   Smoking status: Never   Smokeless tobacco: Never  Vaping Use   Vaping Use: Never used  Substance Use Topics  Alcohol use: No   Drug use: No     Allergies   Patient has no known allergies.   Review of Systems Review of Systems  Gastrointestinal: Negative.   Genitourinary:  Positive for dysuria, vaginal discharge and vaginal pain. Negative for decreased urine volume, difficulty urinating, dyspareunia, enuresis, flank pain, frequency, genital sores, hematuria, menstrual problem, pelvic pain, urgency and vaginal bleeding.  Skin: Negative.     Physical Exam Triage Vital Signs ED Triage Vitals [04/29/21 1659]  Enc Vitals Group     BP 108/60     Pulse Rate 75     Resp 18     Temp 99.1 F (37.3 C)     Temp Source Oral     SpO2 100 %     Weight      Height      Head Circumference      Peak Flow      Pain Score      Pain Loc      Pain Edu?      Excl. in [place]?    No  data found.  Updated Vital Signs BP 108/60 (BP Location: Left Arm)    Pulse 75    Temp 99.1 F (37.3 C) (Oral)    Resp 18    LMP 04/29/2021    SpO2 100%   Visual Acuity Right Eye Distance:   Left Eye Distance:   Bilateral Distance:    Right Eye Near:   Left Eye Near:    Bilateral Near:     Physical Exam Constitutional:      Appearance: Normal appearance. She is normal weight.  HENT:     Head: Normocephalic.  Eyes:     Extraocular Movements: Extraocular movements intact.  Pulmonary:     Effort: Pulmonary effort is normal.  Genitourinary:    Comments: Deferred, self collect vaginal swab Skin:    General: Skin is warm and dry.  Neurological:     General: No focal deficit present.     Mental Status: She is alert and oriented to person, place, and time. Mental status is at baseline.  Psychiatric:        Mood and Affect: Mood normal.        Behavior: Behavior normal.     UC Treatments / Results  Labs (all labs ordered are listed, but only abnormal results are displayed) Labs Reviewed  CERVICOVAGINAL ANCILLARY ONLY    EKG   Radiology No results found.  Procedures Procedures (including critical care time)  Medications Ordered in UC Medications - No data to display  Initial Impression / Assessment and Plan / UC Course  I have reviewed the triage vital signs and the nursing notes.  Pertinent labs & imaging results that were available during my care of the patient were reviewed by me and considered in my medical decision making (see chart for details).  Vaginal discharge  Patient shows photos of genitalia taken today, bilateral labia majora and minora reddened wit mild swelling, symptomology most likely caused by yeast, discussed with patient, will treat prophylactically while STI screening is pending, Diflucan prescribed, reviewed administration, patient may continue use of external Monistat, with follow-up with urgent care as needed, advised abstinence until  medication is complete, symptoms have resolved and all testing has returned.  Encouraged discontinuation of scented exfoliation products and return to not scented soaps.  Encourage patient to use wash cough and circular motions for future exfoliation. Final Clinical Impressions(s) / UC Diagnoses   Final diagnoses:  None   Discharge Instructions   None    ED Prescriptions   None    PDMP not reviewed this encounter.   Hans Eden, NP 04/29/21 [phone]

## 2021-04-29 NOTE — Discharge Instructions (Addendum)
Today you are being treated prophylactically for yeast.   Take diflucan 150 mg once, if symptoms still present in 3 days then you may take second pill   Yeast infections which are caused by a naturally occurring fungus called candida. Vaginosis is an inflammation of the vagina that can result in discharge, itching and pain. The cause is usually a change in the normal balance of vaginal bacteria or an infection. Vaginosis can also result from reduced estrogen levels after menopause.  Labs pending 2-3 days, you will be contacted if positive for any sti and treatment will be sent to the pharmacy, you will have to return to the clinic if positive for gonorrhea to receive treatment   Please refrain from having sex until labs results, if positive please refrain from having sex until treatment complete and symptoms resolve   If positive for Chlamydia  gonorrhea or trichomoniasis please notify partner or partners so they may tested as well  Moving forward, it is recommended you use some form of protection against the transmission of sti infections  such as condoms or dental dams with each sexual encounter    In addition:   Avoid baths, hot tubs and whirlpool spas.  Don't use scented or harsh soaps, such as those with deodorant or antibacterial action. Avoid irritants. These include scented tampons and pads. Wipe from front to back after using the toilet.  Don't douche. Your vagina doesn't require cleansing other than normal bathing.  Use a  condom. Wear cotton underwear, this fabric helps absorb moisture   

## 2021-05-02 ENCOUNTER — Telehealth (HOSPITAL_COMMUNITY): Payer: Self-pay | Admitting: Emergency Medicine

## 2021-05-02 LAB — CERVICOVAGINAL ANCILLARY ONLY
Bacterial Vaginitis (gardnerella): POSITIVE — AB
Candida Glabrata: NEGATIVE
Candida Vaginitis: POSITIVE — AB
Chlamydia: NEGATIVE
Comment: NEGATIVE
Comment: NEGATIVE
Comment: NEGATIVE
Comment: NEGATIVE
Comment: NEGATIVE
Comment: NORMAL
Neisseria Gonorrhea: NEGATIVE
Trichomonas: NEGATIVE

## 2021-05-02 MED ORDER — METRONIDAZOLE 500 MG PO TABS: 500.0000 mg | ORAL_TABLET | Freq: Two times a day (BID) | ORAL | 0 refills | Status: DC

## 2021-06-07 ENCOUNTER — Other Ambulatory Visit: Payer: Self-pay

## 2021-06-07 ENCOUNTER — Encounter (HOSPITAL_COMMUNITY): Payer: Self-pay

## 2021-06-07 ENCOUNTER — Ambulatory Visit (HOSPITAL_COMMUNITY)
Admission: EM | Admit: 2021-06-07 | Discharge: 2021-06-07 | Disposition: A | Discharge status: Home / Self Care | Payer: 59 | Attending: Physician Assistant | Admitting: Physician Assistant

## 2021-06-07 DIAGNOSIS — Z20828 Contact with and (suspected) exposure to other viral communicable diseases: Secondary | ICD-10-CM | POA: Insufficient documentation

## 2021-06-07 DIAGNOSIS — Z20822 Contact with and (suspected) exposure to covid-19: Secondary | ICD-10-CM | POA: Diagnosis not present

## 2021-06-07 DIAGNOSIS — Z87440 Personal history of urinary (tract) infections: Secondary | ICD-10-CM | POA: Diagnosis not present

## 2021-06-07 DIAGNOSIS — J069 Acute upper respiratory infection, unspecified: Secondary | ICD-10-CM | POA: Insufficient documentation

## 2021-06-07 DIAGNOSIS — J45909 Unspecified asthma, uncomplicated: Secondary | ICD-10-CM | POA: Insufficient documentation

## 2021-06-07 LAB — POC INFLUENZA A AND B ANTIGEN (URGENT CARE ONLY)
INFLUENZA A ANTIGEN, POC: NEGATIVE
INFLUENZA B ANTIGEN, POC: NEGATIVE

## 2021-06-07 MED ORDER — PROMETHAZINE-DM 6.25-15 MG/5ML PO SYRP: 5.0000 mL | ORAL_SOLUTION | Freq: Three times a day (TID) | ORAL | 0 refills | Status: DC | PRN

## 2021-06-07 NOTE — Discharge Instructions (Signed)
Your flu testing was negative.  We will contact you if your COVID test is positive.  Please remain in isolation until you receive your results.  Use Promethazine DM for cough.  This can make you sleepy so do not drive or drink alcohol taking it.  Use Tylenol, Mucinex, Flonase for symptom relief.  Make sure you are drinking plenty of fluid.  If your symptoms not improving by next week return here see your PCP.  If anything worsens and you develop high fever, chest pain, shortness of breath, nausea/vomiting interfering with oral intake, weakness you need to go to emergency room.

## 2021-06-07 NOTE — ED Triage Notes (Signed)
Pt c/o cough, congestion, sore throat, low grade temp, headaches, and lt ear pain since Sunday. Taking OTC meds with little relief.

## 2021-06-07 NOTE — ED Provider Notes (Signed)
[place]    CSN: [id number] Arrival date & time: 06/07/21  1327      History   Chief Complaint Chief Complaint  Patient presents with   Cough    HPI Suzanne Wolfe is a 26 y.o. female.   Patient presents today with a 2-day history of URI symptoms.  Reports sore throat, cough, fever with T-max 101.3 F, body aches, nasal congestion.  Denies any chest pain, shortness of breath, nausea, vomiting.  Reports she has been exposed to both the flu and COVID-19 through friends and employment.  She did not have COVID test that was negative.  She has had influenza and COVID-19 vaccines.  She has not had COVID in the past.  She was treated with antibiotics approximately a month ago for UTI but denies additional antibiotics since that time.  She has been using Tylenol and TheraFlu without improvement of symptoms.  She does have a history of asthma and did require albuterol inhaler when symptoms first began but is not required it more frequently since then.   Past Medical History:  Diagnosis Date   History of Clostridium difficile infection 02/ 2018  resolve w/ antibiotic   Mild asthma    Patellar instability of right knee     Patient Active Problem List   Diagnosis Date Noted   Patellar instability of right knee 10/31/2016   Infected pilonidal cyst 10/22/2014   Pilonidal sinus 07/02/2014   Episodic tension-type headache, not intractable 12/04/2013   Migraine without aura and without status migrainosus, not intractable 12/04/2013    Past Surgical History:  Procedure Laterality Date   COLONOSCOPY  08-04-2016  dr stark   ESOPHAGOGASTRODUODENOSCOPY  06/20/2016   LAPAROSCOPIC APPENDECTOMY  07/16/2009   MEDIAL PATELLOFEMORAL LIGAMENT REPAIR Right 10/31/2016   Procedure: Right knee arthroscopic assisted medial patella femoral ligament reconstruction;  Surgeon: Nicholes Stairs, MD;  Location: [facility];  Service: Orthopedics;  Laterality: Right;   PILONIDAL  CYST EXCISION N/A 07/02/2014   Procedure: EXCISION OF PILONIDAL CYST WITH PRIMARY CLOSURE;  Surgeon: Jerilynn Mages. Gerald Stabs, MD;  Location: [place];  Service: Pediatrics;  Laterality: N/A;   PILONIDAL CYST EXCISION N/A 10/22/2014   Procedure: EXCISION PILONIDAL CYST AND SINUS;  Surgeon: Gerald Stabs, MD;  Location: [place];  Service: Pediatrics;  Laterality: N/A;  Re-excision   WISDOM TOOTH EXTRACTION      OB History     Gravida  1   Para      Term      Preterm      AB      Living         SAB      IAB      Ectopic      Multiple      Live Births               Home Medications    Prior to Admission medications   Medication Sig Start Date End Date Taking? Authorizing Provider  promethazine-dextromethorphan (PROMETHAZINE-DM) 6.25-15 MG/5ML syrup Take 5 mLs by mouth 3 (three) times daily as needed for cough. 06/07/21  Yes Elianys Conry, Derry Skill, PA-C  acetaminophen (TYLENOL) 500 MG tablet Take 500 mg by mouth every 6 (six) hours as needed.    [provider]  ALBUTEROL IN Inhale into the lungs.    [provider]    Family History Family History  Problem Relation Age of Onset   Diabetes Maternal Grandmother  Hypertension Maternal Grandmother    Heart disease Maternal Grandmother        hx. open heart surgery   Asthma Father    Colon cancer Neg Hx    Esophageal cancer Neg Hx     Social History Social History   Tobacco Use   Smoking status: Never   Smokeless tobacco: Never  Vaping Use   Vaping Use: Never used  Substance Use Topics   Alcohol use: No   Drug use: No     Allergies   Patient has no known allergies.   Review of Systems Review of Systems  Constitutional:  Positive for activity change, fatigue and fever. Negative for appetite change.  HENT:  Positive for congestion and sore throat. Negative for sinus pressure and sneezing.   Respiratory:  Positive for cough. Negative for shortness of breath.    Cardiovascular:  Negative for chest pain.  Gastrointestinal:  Negative for abdominal pain, diarrhea, nausea and vomiting.  Musculoskeletal:  Positive for arthralgias and myalgias.  Neurological:  Positive for headaches. Negative for dizziness and light-headedness.    Physical Exam Triage Vital Signs ED Triage Vitals  Enc Vitals Group     BP 06/07/21 1450 123/89     Pulse Rate 06/07/21 1450 (!) 107     Resp 06/07/21 1450 18     Temp 06/07/21 1450 99 F (37.2 C)     Temp src --      SpO2 06/07/21 1450 98 %     Weight --      Height --      Head Circumference --      Peak Flow --      Pain Score 06/07/21 1451 7     Pain Loc --      Pain Edu? --      Excl. in [place]? --    No data found.  Updated Vital Signs BP 123/89 (BP Location: Left Arm)    Pulse (!) 107    Temp 99 F (37.2 C)    Resp 18    LMP 05/24/2021    SpO2 98%   Visual Acuity Right Eye Distance:   Left Eye Distance:   Bilateral Distance:    Right Eye Near:   Left Eye Near:    Bilateral Near:     Physical Exam Vitals reviewed.  Constitutional:      General: She is awake. She is not in acute distress.    Appearance: Normal appearance. She is well-developed. She is not ill-appearing.     Comments: Very pleasant female appears stated age in no acute distress sitting comfortably in exam room  HENT:     Head: Normocephalic and atraumatic.     Right Ear: Tympanic membrane, ear canal and external ear normal. No middle ear effusion. Tympanic membrane is not erythematous or bulging.     Left Ear: Ear canal and external ear normal. A middle ear effusion is present. Tympanic membrane is not erythematous or bulging.     Nose:     Right Sinus: No maxillary sinus tenderness or frontal sinus tenderness.     Left Sinus: No maxillary sinus tenderness or frontal sinus tenderness.     Mouth/Throat:     Pharynx: Uvula midline. Posterior oropharyngeal erythema present. No oropharyngeal exudate.  Cardiovascular:     Rate and  Rhythm: Regular rhythm. Tachycardia present.     Heart sounds: Normal heart sounds, S1 normal and S2 normal. No murmur heard. Pulmonary:     Effort:  Pulmonary effort is normal.     Breath sounds: Normal breath sounds. No wheezing, rhonchi or rales.     Comments: Clear to auscultation bilaterally; reactive cough with deep breathing. Lymphadenopathy:     Head:     Right side of head: No submental, submandibular or tonsillar adenopathy.     Left side of head: No submental, submandibular or tonsillar adenopathy.     Cervical: No cervical adenopathy.  Psychiatric:        Behavior: Behavior is cooperative.     UC Treatments / Results  Labs (all labs ordered are listed, but only abnormal results are displayed) Labs Reviewed  SARS CORONAVIRUS 2 (TAT 6-24 HRS)  POC INFLUENZA A AND B ANTIGEN (URGENT CARE ONLY)    EKG   Radiology No results found.  Procedures Procedures (including critical care time)  Medications Ordered in UC Medications - No data to display  Initial Impression / Assessment and Plan / UC Course  I have reviewed the triage vital signs and the nursing notes.  Pertinent labs & imaging results that were available during my care of the patient were reviewed by me and considered in my medical decision making (see chart for details).     Discussed likely viral etiology given short duration of symptoms.  Flu testing was negative in clinic today.  COVID test is pending.  Patient was instructed to remain in isolation until she receives her COVID test results; she will monitor her MyChart for these results.  She was provided work excuse note with current CDC return to work guidelines based on COVID testing result.  She was prescribed Promethazine DM for cough with instruction not to drive or drink alcohol while taking this as drowsiness is a common side effect.  She can use Mucinex, Flonase, Tylenol for additional symptom relief.  She is to rest and drink plenty of fluid.   Discussed that if she has any worsening symptoms including chest pain, shortness of breath, nausea/vomiting interfering with oral intake, weakness she needs to go to the emergency room.  If symptoms have not improved by next week she is to return here or see her PCP.  Strict return precautions given to which she expressed understanding.  Final Clinical Impressions(s) / UC Diagnoses   Final diagnoses:  Viral URI with cough     Discharge Instructions      Your flu testing was negative.  We will contact you if your COVID test is positive.  Please remain in isolation until you receive your results.  Use Promethazine DM for cough.  This can make you sleepy so do not drive or drink alcohol taking it.  Use Tylenol, Mucinex, Flonase for symptom relief.  Make sure you are drinking plenty of fluid.  If your symptoms not improving by next week return here see your PCP.  If anything worsens and you develop high fever, chest pain, shortness of breath, nausea/vomiting interfering with oral intake, weakness you need to go to emergency room.     ED Prescriptions     Medication Sig Dispense Auth. Provider   promethazine-dextromethorphan (PROMETHAZINE-DM) 6.25-15 MG/5ML syrup Take 5 mLs by mouth 3 (three) times daily as needed for cough. 118 mL Kenyona Rena K, PA-C      PDMP not reviewed this encounter.   Terrilee Croak, PA-C 06/07/21 1553

## 2021-06-08 LAB — SARS CORONAVIRUS 2 (TAT 6-24 HRS): SARS Coronavirus 2: NEGATIVE

## 2021-06-11 ENCOUNTER — Ambulatory Visit (HOSPITAL_COMMUNITY)
Admission: EM | Admit: 2021-06-11 | Discharge: 2021-06-11 | Disposition: A | Discharge status: Home / Self Care | Payer: 59 | Attending: Student | Admitting: Student

## 2021-06-11 ENCOUNTER — Encounter (HOSPITAL_COMMUNITY): Payer: Self-pay | Admitting: *Deleted

## 2021-06-11 ENCOUNTER — Other Ambulatory Visit: Payer: Self-pay

## 2021-06-11 DIAGNOSIS — R0789 Other chest pain: Secondary | ICD-10-CM

## 2021-06-11 DIAGNOSIS — J4521 Mild intermittent asthma with (acute) exacerbation: Secondary | ICD-10-CM | POA: Diagnosis not present

## 2021-06-11 DIAGNOSIS — B349 Viral infection, unspecified: Secondary | ICD-10-CM

## 2021-06-11 DIAGNOSIS — Z760 Encounter for issue of repeat prescription: Secondary | ICD-10-CM | POA: Diagnosis not present

## 2021-06-11 MED ORDER — DM-GUAIFENESIN ER 30-600 MG PO TB12: 1.0000 | ORAL_TABLET | Freq: Two times a day (BID) | ORAL | 0 refills | Status: DC | PRN

## 2021-06-11 MED ORDER — PREDNISONE 10 MG (21) PO TBPK: ORAL_TABLET | Freq: Every day | ORAL | 0 refills | Status: DC

## 2021-06-11 MED ORDER — ALBUTEROL SULFATE HFA 108 (90 BASE) MCG/ACT IN AERS: 1.0000 | INHALATION_SPRAY | Freq: Four times a day (QID) | RESPIRATORY_TRACT | 0 refills | Status: DC | PRN

## 2021-06-11 MED ORDER — PROMETHAZINE-DM 6.25-15 MG/5ML PO SYRP: 5.0000 mL | ORAL_SOLUTION | Freq: Three times a day (TID) | ORAL | 0 refills | Status: DC | PRN

## 2021-06-11 NOTE — ED Provider Notes (Signed)
[place]    CSN: [id number] Arrival date & time: 06/11/21  1338      History   Chief Complaint Chief Complaint  Patient presents with   Fever   Cough   Sore Throat    HPI Suzanne Wolfe is a 26 y.o. female presenting with continued sore throat, cough.  Medical history asthma.  We last saw her for URI on 06/07/2021, at that time she endorsed a 2-day history of symptoms, negative influenza and COVID testing at that time.  Promethazine DM was sent for sleep, and she was advised to also take over-the-counter medications during the day. States now symptoms x6 days, cough is getting worse with wheezing. Temperature running 100.4 one day ago. Has been taking theraflu during the day. Persistent sore throat. Using albuterol inhaler 3x daily with temporary improvement. Cough is nonproductive. Chest wall pain with coughing only, denies CP at rest.   HPI  Past Medical History:  Diagnosis Date   History of Clostridium difficile infection 02/ 2018  resolve w/ antibiotic   Mild asthma    Patellar instability of right knee     Patient Active Problem List   Diagnosis Date Noted   Patellar instability of right knee 10/31/2016   Infected pilonidal cyst 10/22/2014   Pilonidal sinus 07/02/2014   Episodic tension-type headache, not intractable 12/04/2013   Migraine without aura and without status migrainosus, not intractable 12/04/2013    Past Surgical History:  Procedure Laterality Date   COLONOSCOPY  08-04-2016  dr stark   ESOPHAGOGASTRODUODENOSCOPY  06/20/2016   LAPAROSCOPIC APPENDECTOMY  07/16/2009   MEDIAL PATELLOFEMORAL LIGAMENT REPAIR Right 10/31/2016   Procedure: Right knee arthroscopic assisted medial patella femoral ligament reconstruction;  Surgeon: Nicholes Stairs, MD;  Location: [facility];  Service: Orthopedics;  Laterality: Right;   PILONIDAL CYST EXCISION N/A 07/02/2014   Procedure: EXCISION OF PILONIDAL CYST WITH PRIMARY CLOSURE;  Surgeon: Jerilynn Mages.  Gerald Stabs, MD;  Location: [place];  Service: Pediatrics;  Laterality: N/A;   PILONIDAL CYST EXCISION N/A 10/22/2014   Procedure: EXCISION PILONIDAL CYST AND SINUS;  Surgeon: Gerald Stabs, MD;  Location: [place];  Service: Pediatrics;  Laterality: N/A;  Re-excision   WISDOM TOOTH EXTRACTION      OB History     Gravida  1   Para      Term      Preterm      AB      Living         SAB      IAB      Ectopic      Multiple      Live Births               Home Medications    Prior to Admission medications   Medication Sig Start Date End Date Taking? Authorizing Provider  albuterol (VENTOLIN HFA) 108 (90 Base) MCG/ACT inhaler Inhale 1-2 puffs into the lungs every 6 (six) hours as needed for wheezing or shortness of breath. 06/11/21  Yes Hazel Sams, PA-C  dextromethorphan-guaiFENesin [facility] DM) 30-600 MG 12hr tablet Take 1 tablet by mouth 2 (two) times daily as needed for cough. 06/11/21  Yes Hazel Sams, PA-C  predniSONE (STERAPRED UNI-PAK 21 TAB) 10 MG (21) TBPK tablet Take by mouth daily. Take 6 tabs by mouth daily  for 2 days, then 5 tabs for 2 days, then 4 tabs for 2 days, then 3 tabs for 2 days, 2  tabs for 2 days, then 1 tab by mouth daily for 2 days 06/11/21  Yes Phillip Heal, Sherlon Handing, PA-C  acetaminophen (TYLENOL) 500 MG tablet Take 500 mg by mouth every 6 (six) hours as needed.    [provider]  ALBUTEROL IN Inhale into the lungs.    [provider]  promethazine-dextromethorphan (PROMETHAZINE-DM) 6.25-15 MG/5ML syrup Take 5 mLs by mouth 3 (three) times daily as needed for cough. 06/11/21   Hazel Sams, PA-C    Family History Family History  Problem Relation Age of Onset   Diabetes Maternal Grandmother    Hypertension Maternal Grandmother    Heart disease Maternal Grandmother        hx. open heart surgery   Asthma Father    Colon cancer Neg Hx    Esophageal cancer Neg Hx     Social  History Social History   Tobacco Use   Smoking status: Never   Smokeless tobacco: Never  Vaping Use   Vaping Use: Never used  Substance Use Topics   Alcohol use: No   Drug use: No     Allergies   Patient has no known allergies.   Review of Systems Review of Systems  Constitutional:  Negative for appetite change, chills and fever.  HENT:  Negative for congestion, ear pain, rhinorrhea, sinus pressure, sinus pain and sore throat.   Eyes:  Negative for redness and visual disturbance.  Respiratory:  Positive for cough and wheezing. Negative for chest tightness and shortness of breath.   Cardiovascular:  Negative for chest pain and palpitations.  Gastrointestinal:  Negative for abdominal pain, constipation, diarrhea, nausea and vomiting.  Genitourinary:  Negative for dysuria, frequency and urgency.  Musculoskeletal:  Negative for myalgias.  Neurological:  Negative for dizziness, weakness and headaches.  Psychiatric/Behavioral:  Negative for confusion.   All other systems reviewed and are negative.   Physical Exam Triage Vital Signs ED Triage Vitals  Enc Vitals Group     BP      Pulse      Resp      Temp      Temp src      SpO2      Weight      Height      Head Circumference      Peak Flow      Pain Score      Pain Loc      Pain Edu?      Excl. in [place]?    No data found.  Updated Vital Signs BP 119/73    Pulse (!) 106    Temp 98.3 F (36.8 C)    Resp 20    LMP 05/24/2021    SpO2 100%   Visual Acuity Right Eye Distance:   Left Eye Distance:   Bilateral Distance:    Right Eye Near:   Left Eye Near:    Bilateral Near:     Physical Exam Vitals reviewed.  Constitutional:      General: She is not in acute distress.    Appearance: Normal appearance. She is not ill-appearing.  HENT:     Head: Normocephalic and atraumatic.     Right Ear: Tympanic membrane, ear canal and external ear normal. No tenderness. No middle ear effusion. There is impacted cerumen.  Tympanic membrane is not perforated, erythematous, retracted or bulging.     Left Ear: Tympanic membrane, ear canal and external ear normal. No tenderness.  No middle ear effusion. There is impacted cerumen.  Tympanic membrane is not perforated, erythematous, retracted or bulging.     Ears:     Comments: Bilateral canals 80% occluded by cerumen     Nose: Nose normal. No congestion.     Mouth/Throat:     Mouth: Mucous membranes are moist.     Pharynx: Uvula midline. No oropharyngeal exudate or posterior oropharyngeal erythema.  Eyes:     Extraocular Movements: Extraocular movements intact.     Pupils: Pupils are equal, round, and reactive to light.  Cardiovascular:     Rate and Rhythm: Normal rate and regular rhythm.     Heart sounds: Normal heart sounds.  Pulmonary:     Effort: Pulmonary effort is normal.     Breath sounds: Wheezing present. No decreased breath sounds, rhonchi or rales.     Comments: Freq cough  Occ expiratory wheezes  Chest:     Chest wall: Tenderness present.     Comments: TTP anterior chest wall  Abdominal:     Palpations: Abdomen is soft.     Tenderness: There is no abdominal tenderness. There is no guarding or rebound.  Lymphadenopathy:     Cervical: No cervical adenopathy.     Right cervical: No superficial cervical adenopathy.    Left cervical: No superficial cervical adenopathy.  Neurological:     General: No focal deficit present.     Mental Status: She is alert and oriented to person, place, and time.  Psychiatric:        Mood and Affect: Mood normal.        Behavior: Behavior normal.        Thought Content: Thought content normal.        Judgment: Judgment normal.     UC Treatments / Results  Labs (all labs ordered are listed, but only abnormal results are displayed) Labs Reviewed - No data to display  EKG   Radiology No results found.  Procedures Procedures (including critical care time)  Medications Ordered in UC Medications - No  data to display  Initial Impression / Assessment and Plan / UC Course  I have reviewed the triage vital signs and the nursing notes.  Pertinent labs & imaging results that were available during my care of the patient were reviewed by me and considered in my medical decision making (see chart for details).     This patient is a very pleasant 26 y.o. year old female presenting with asthma exacerbation following viral syndrome Afebrile, borderline tachy at 106. Symptoms x6 days. We last saw her for URI on 06/07/2021, negative influenza and COVID testing at that time.  Promethazine DM was sent for sleep, and she was advised to also take over-the-counter medications during the day.  Albuterol inhaler refilled. Prednisone taper sent. Refilled promethazine DM.   Chest wall pain is reproducible, does not smoke or take OCP, wells score for PE 1.5 given tachycardia, though this is most likely related to the asthma exacerbation. Strict ED return precautions discussed. Patient verbalizes understanding and agreement.   Final Clinical Impressions(s) / UC Diagnoses   Final diagnoses:  Mild intermittent asthma with acute exacerbation  Viral syndrome  Medication refill  Chest wall pain     Discharge Instructions      -Promethazine DM cough syrup for congestion/cough. This could make you drowsy, so take at night before bed. -Mucinex DM during the daytime -Albuterol inhaler as needed for cough, wheezing, shortness of breath, 1 to 2 puffs every 6 hours as needed. -Prednisone taper for cough/bronchitis.  I recommend taking this in the morning as it could give you energy.  Avoid NSAIDs like ibuprofen and alleve while taking this medication as they can increase your risk of stomach upset and even GI bleeding when in combination with a steroid. You can continue tylenol (acetaminophen) up to 1000mg  3x daily. -With a virus, you're typically contagious for 5-7 days, or as long as you're having fevers.   -Follow-up if symptoms worsen: shortness of breath, chest pain at rest, new/higher fevers, etc.        ED Prescriptions     Medication Sig Dispense Auth. Provider   promethazine-dextromethorphan (PROMETHAZINE-DM) 6.25-15 MG/5ML syrup Take 5 mLs by mouth 3 (three) times daily as needed for cough. 118 mL Hazel Sams, PA-C   albuterol (VENTOLIN HFA) 108 (90 Base) MCG/ACT inhaler Inhale 1-2 puffs into the lungs every 6 (six) hours as needed for wheezing or shortness of breath. 1 each Hazel Sams, PA-C   predniSONE (STERAPRED UNI-PAK 21 TAB) 10 MG (21) TBPK tablet Take by mouth daily. Take 6 tabs by mouth daily  for 2 days, then 5 tabs for 2 days, then 4 tabs for 2 days, then 3 tabs for 2 days, 2 tabs for 2 days, then 1 tab by mouth daily for 2 days 42 tablet Hazel Sams, PA-C   dextromethorphan-guaiFENesin [facility] DM) 30-600 MG 12hr tablet Take 1 tablet by mouth 2 (two) times daily as needed for cough. 12 tablet Hazel Sams, PA-C      PDMP not reviewed this encounter.   Hazel Sams, PA-C 06/11/21 1542

## 2021-06-11 NOTE — Discharge Instructions (Addendum)
-  Promethazine DM cough syrup for congestion/cough. This could make you drowsy, so take at night before bed. -Mucinex DM during the daytime -Albuterol inhaler as needed for cough, wheezing, shortness of breath, 1 to 2 puffs every 6 hours as needed. -Prednisone taper for cough/bronchitis. I recommend taking this in the morning as it could give you energy.  Avoid NSAIDs like ibuprofen and alleve while taking this medication as they can increase your risk of stomach upset and even GI bleeding when in combination with a steroid. You can continue tylenol (acetaminophen) up to 1000mg  3x daily. -With a virus, you're typically contagious for 5-7 days, or as long as you're having fevers.  -Follow-up if symptoms worsen: shortness of breath, chest pain at rest, new/higher fevers, etc.

## 2021-06-11 NOTE — ED Triage Notes (Signed)
Pt reports sore throat and cough have not improved. Pt still has a fever. Yesterday 100.4 .

## 2021-06-22 ENCOUNTER — Other Ambulatory Visit: Payer: Self-pay

## 2021-06-22 ENCOUNTER — Ambulatory Visit (HOSPITAL_COMMUNITY)
Admission: EM | Admit: 2021-06-22 | Discharge: 2021-06-22 | Disposition: A | Discharge status: Home / Self Care | Payer: 59 | Attending: Physician Assistant | Admitting: Physician Assistant

## 2021-06-22 ENCOUNTER — Encounter (HOSPITAL_COMMUNITY): Payer: Self-pay | Admitting: Emergency Medicine

## 2021-06-22 DIAGNOSIS — J45901 Unspecified asthma with (acute) exacerbation: Secondary | ICD-10-CM | POA: Diagnosis not present

## 2021-06-22 DIAGNOSIS — J019 Acute sinusitis, unspecified: Secondary | ICD-10-CM

## 2021-06-22 MED ORDER — AMOXICILLIN-POT CLAVULANATE 875-125 MG PO TABS: 1.0000 | ORAL_TABLET | Freq: Two times a day (BID) | ORAL | 0 refills | Status: DC

## 2021-06-22 NOTE — ED Triage Notes (Signed)
Pt reports cough and nasal congestion still has not improved since previous visits.

## 2021-06-22 NOTE — ED Provider Notes (Signed)
[facility]    CSN: [id number] Arrival date & time: 06/22/21  1208      History   Chief Complaint Chief Complaint  Patient presents with   URI    HPI Suzanne Wolfe is a 26 y.o. female.   Patient here today for evaluation of cough and nasal congestion and sinus pressure that has not improved since being seen over a week ago. She reports she just finished steroid burst but still has some wheezing. She has not had fever since illness initially started. She has been using her albuterol with mild relief.   The history is provided by the patient.   Past Medical History:  Diagnosis Date   History of Clostridium difficile infection 02/ 2018  resolve w/ antibiotic   Mild asthma    Patellar instability of right knee     Patient Active Problem List   Diagnosis Date Noted   Patellar instability of right knee 10/31/2016   Infected pilonidal cyst 10/22/2014   Pilonidal sinus 07/02/2014   Episodic tension-type headache, not intractable 12/04/2013   Migraine without aura and without status migrainosus, not intractable 12/04/2013    Past Surgical History:  Procedure Laterality Date   COLONOSCOPY  08-04-2016  dr stark   ESOPHAGOGASTRODUODENOSCOPY  06/20/2016   LAPAROSCOPIC APPENDECTOMY  07/16/2009   MEDIAL PATELLOFEMORAL LIGAMENT REPAIR Right 10/31/2016   Procedure: Right knee arthroscopic assisted medial patella femoral ligament reconstruction;  Surgeon: Nicholes Stairs, MD;  Location: [facility];  Service: Orthopedics;  Laterality: Right;   PILONIDAL CYST EXCISION N/A 07/02/2014   Procedure: EXCISION OF PILONIDAL CYST WITH PRIMARY CLOSURE;  Surgeon: Jerilynn Mages. Gerald Stabs, MD;  Location: [place];  Service: Pediatrics;  Laterality: N/A;   PILONIDAL CYST EXCISION N/A 10/22/2014   Procedure: EXCISION PILONIDAL CYST AND SINUS;  Surgeon: Gerald Stabs, MD;  Location: [place];  Service: Pediatrics;  Laterality: N/A;  Re-excision    WISDOM TOOTH EXTRACTION      OB History     Gravida  1   Para      Term      Preterm      AB      Living         SAB      IAB      Ectopic      Multiple      Live Births               Home Medications    Prior to Admission medications   Medication Sig Start Date End Date Taking? Authorizing Provider  amoxicillin-clavulanate (AUGMENTIN) 875-125 MG tablet Take 1 tablet by mouth every 12 (twelve) hours. 06/22/21  Yes Francene Finders, PA-C  acetaminophen (TYLENOL) 500 MG tablet Take 500 mg by mouth every 6 (six) hours as needed.    [provider]  albuterol (VENTOLIN HFA) 108 (90 Base) MCG/ACT inhaler Inhale 1-2 puffs into the lungs every 6 (six) hours as needed for wheezing or shortness of breath. 06/11/21   Hazel Sams, PA-C  ALBUTEROL IN Inhale into the lungs.    [provider]  dextromethorphan-guaiFENesin (MUCINEX DM) 30-600 MG 12hr tablet Take 1 tablet by mouth 2 (two) times daily as needed for cough. 06/11/21   Hazel Sams, PA-C  predniSONE (STERAPRED UNI-PAK 21 TAB) 10 MG (21) TBPK tablet Take by mouth daily. Take 6 tabs by mouth daily  for 2 days, then 5 tabs for 2 days, then 4 tabs  for 2 days, then 3 tabs for 2 days, 2 tabs for 2 days, then 1 tab by mouth daily for 2 days 06/11/21   Hazel Sams, PA-C  promethazine-dextromethorphan (PROMETHAZINE-DM) 6.25-15 MG/5ML syrup Take 5 mLs by mouth 3 (three) times daily as needed for cough. 06/11/21   Hazel Sams, PA-C    Family History Family History  Problem Relation Age of Onset   Healthy Mother    Asthma Father    Diabetes Maternal Grandmother    Hypertension Maternal Grandmother    Heart disease Maternal Grandmother        hx. open heart surgery   Colon cancer Neg Hx    Esophageal cancer Neg Hx     Social History Social History   Tobacco Use   Smoking status: Never   Smokeless tobacco: Never  Vaping Use   Vaping Use: Never used  Substance Use Topics   Alcohol  use: No   Drug use: No     Allergies   Patient has no known allergies.   Review of Systems Review of Systems  Constitutional:  Negative for chills and fever.  HENT:  Positive for congestion and sinus pressure. Negative for ear pain and sore throat.   Eyes:  Negative for discharge and redness.  Respiratory:  Positive for cough and wheezing. Negative for shortness of breath.   Gastrointestinal:  Negative for abdominal pain, diarrhea, nausea and vomiting.    Physical Exam Triage Vital Signs ED Triage Vitals  Enc Vitals Group     BP      Pulse      Resp      Temp      Temp src      SpO2      Weight      Height      Head Circumference      Peak Flow      Pain Score      Pain Loc      Pain Edu?      Excl. in [place]?    No data found.  Updated Vital Signs BP 127/83 (BP Location: Left Arm)    Pulse 90    Temp 98.2 F (36.8 C) (Oral)    Resp 20    Ht 5' (1.524 m)    Wt 158 lb 4.6 oz (71.8 kg)    LMP 05/24/2021    SpO2 98%    BMI 30.91 kg/m   Physical Exam Vitals and nursing note reviewed.  Constitutional:      General: She is not in acute distress.    Appearance: Normal appearance. She is not ill-appearing.  HENT:     Head: Normocephalic and atraumatic.     Nose: Congestion present.     Mouth/Throat:     Mouth: Mucous membranes are moist.     Pharynx: No oropharyngeal exudate or posterior oropharyngeal erythema.  Eyes:     Conjunctiva/sclera: Conjunctivae normal.  Cardiovascular:     Rate and Rhythm: Normal rate and regular rhythm.     Heart sounds: Normal heart sounds. No murmur heard. Pulmonary:     Effort: Pulmonary effort is normal. No respiratory distress.     Breath sounds: Normal breath sounds. No wheezing, rhonchi or rales.  Skin:    General: Skin is warm and dry.  Neurological:     Mental Status: She is alert.  Psychiatric:        Mood and Affect: Mood normal.  Thought Content: Thought content normal.     UC Treatments / Results  Labs (all  labs ordered are listed, but only abnormal results are displayed) Labs Reviewed - No data to display  EKG   Radiology No results found.  Procedures Procedures (including critical care time)  Medications Ordered in UC Medications - No data to display  Initial Impression / Assessment and Plan / UC Course  I have reviewed the triage vital signs and the nursing notes.  Pertinent labs & imaging results that were available during my care of the patient were reviewed by me and considered in my medical decision making (see chart for details).    Will trial antibiotic therapy to cover sinusitis, etc. Will defer further steroid treatment at this time but encouraged patient to follow up if symptoms fail to improve or worsen in any way.   Final Clinical Impressions(s) / UC Diagnoses   Final diagnoses:  Acute sinusitis, recurrence not specified, unspecified location  Asthma with acute exacerbation, unspecified asthma severity, unspecified whether persistent   Discharge Instructions   None    ED Prescriptions     Medication Sig Dispense Auth. Provider   amoxicillin-clavulanate (AUGMENTIN) 875-125 MG tablet Take 1 tablet by mouth every 12 (twelve) hours. 14 tablet Francene Finders, PA-C      PDMP not reviewed this encounter.   Francene Finders, PA-C 06/22/21 1355

## 2021-07-12 ENCOUNTER — Other Ambulatory Visit (HOSPITAL_COMMUNITY): Payer: Self-pay

## 2021-07-12 ENCOUNTER — Telehealth: Payer: 59 | Admitting: Family Medicine

## 2021-07-12 DIAGNOSIS — B379 Candidiasis, unspecified: Secondary | ICD-10-CM

## 2021-07-12 MED ORDER — FLUCONAZOLE 150 MG PO TABS
150.0000 mg | ORAL_TABLET | Freq: Every day | ORAL | 0 refills | Status: DC
  Filled 2021-07-12: qty 2, 3d supply, fill #0

## 2021-07-12 NOTE — Progress Notes (Signed)
Virtual Visit Consent   Suzanne Wolfe, you are scheduled for a virtual visit with a [facility] provider today.     Just as with appointments in the office, your consent must be obtained to participate.  Your consent will be active for this visit and any virtual visit you may have with one of our providers in the next 365 days.     If you have a MyChart account, a copy of this consent can be sent to you electronically.  All virtual visits are billed to your insurance company just like a traditional visit in the office.    As this is a virtual visit, video technology does not allow for your provider to perform a traditional examination.  This may limit your provider's ability to fully assess your condition.  If your provider identifies any concerns that need to be evaluated in person or the need to arrange testing (such as labs, EKG, etc.), we will make arrangements to do so.     Although advances in technology are sophisticated, we cannot ensure that it will always work on either your end or our end.  If the connection with a video visit is poor, the visit may have to be switched to a telephone visit.  With either a video or telephone visit, we are not always able to ensure that we have a secure connection.     I need to obtain your verbal consent now.   Are you willing to proceed with your visit today?    Suzanne Wolfe has provided verbal consent on 07/12/2021 for a virtual visit (video or telephone).   Freddy Finner, NP   Date: 07/12/2021 9:56 AM   Virtual Visit via Video Note   I, Freddy Finner, connected with  Suzanne Wolfe  ([id number], 1996/03/06) on 07/12/21 at 10:15 AM EST by a video-enabled telemedicine application and verified that I am speaking with the correct person using two identifiers.  Location: Patient: Virtual Visit Location Patient: Home Provider: Virtual Visit Location Provider: Home Office   I discussed the limitations of evaluation and management by telemedicine  and the availability of in person appointments. The patient expressed understanding and agreed to proceed.    History of Present Illness: Suzanne Wolfe is a 26 y.o. who identifies as a female who was assigned female at birth, and is being seen today for yeast infection, has had before. Started using new soap " Dove scent ". Redness, cottage cheese discharge. No unprotected sex, no new partners, no pregnancy- started cycle today.  Denies changes in bladder and bowel habits. Denies fevers chills and no N/V    Problems:  Patient Active Problem List   Diagnosis Date Noted   Patellar instability of right knee 10/31/2016   Infected pilonidal cyst 10/22/2014   Pilonidal sinus 07/02/2014   Episodic tension-type headache, not intractable 12/04/2013   Migraine without aura and without status migrainosus, not intractable 12/04/2013    Allergies: No Known Allergies Medications:  Current Outpatient Medications:    acetaminophen (TYLENOL) 500 MG tablet, Take 500 mg by mouth every 6 (six) hours as needed., Disp: , Rfl:    albuterol (VENTOLIN HFA) 108 (90 Base) MCG/ACT inhaler, Inhale 1-2 puffs into the lungs every 6 (six) hours as needed for wheezing or shortness of breath., Disp: 1 each, Rfl: 0   ALBUTEROL IN, Inhale into the lungs., Disp: , Rfl:    amoxicillin-clavulanate (AUGMENTIN) 875-125 MG tablet, Take 1 tablet by mouth every 12 (twelve) hours., Disp: 14  tablet, Rfl: 0   dextromethorphan-guaiFENesin (MUCINEX DM) 30-600 MG 12hr tablet, Take 1 tablet by mouth 2 (two) times daily as needed for cough., Disp: 12 tablet, Rfl: 0   predniSONE (STERAPRED UNI-PAK 21 TAB) 10 MG (21) TBPK tablet, Take by mouth daily. Take 6 tabs by mouth daily  for 2 days, then 5 tabs for 2 days, then 4 tabs for 2 days, then 3 tabs for 2 days, 2 tabs for 2 days, then 1 tab by mouth daily for 2 days, Disp: 42 tablet, Rfl: 0   promethazine-dextromethorphan (PROMETHAZINE-DM) 6.25-15 MG/5ML syrup, Take 5 mLs by mouth 3 (three)  times daily as needed for cough., Disp: 118 mL, Rfl: 0  Observations/Objective: Patient is well-developed, well-nourished in no acute distress.  Resting comfortably  at home.  Head is normocephalic, atraumatic.  No labored breathing.  Speech is clear and coherent with logical content.  Patient is alert and oriented at baseline.    Assessment and Plan: 1. Yeast infection S&S are consistent with yeast infection post use of a scented soap Hx of them. Denies pregnancy at this time. Advised follow up if not improved   Reviewed side effects, risks and benefits of medication.    Patient acknowledged agreement and understanding of the plan.    - fluconazole (DIFLUCAN) 150 MG tablet; Take 1 tablet (150 mg total) by mouth daily. May repeat in 3 days.  Dispense: 2 tablet; Refill: 0   Follow Up Instructions: I discussed the assessment and treatment plan with the patient. The patient was provided an opportunity to ask questions and all were answered. The patient agreed with the plan and demonstrated an understanding of the instructions.  A copy of instructions were sent to the patient via MyChart unless otherwise noted below.     The patient was advised to call back or seek an in-person evaluation if the symptoms worsen or if the condition fails to improve as anticipated.  Time:  I spent 10 minutes with the patient via telehealth technology discussing the above problems/concerns.    Freddy Finner, NP

## 2021-07-12 NOTE — Patient Instructions (Signed)
Vaginal Yeast Infection, Adult °Vaginal yeast infection is a condition that causes vaginal discharge as well as soreness, swelling, and redness (inflammation) of the vagina. This is a common condition. Some women get this infection frequently. °What are the causes? °This condition is caused by a change in the normal balance of the yeast (Candida) and normal bacteria that live in the vagina. This change causes an overgrowth of yeast, which causes the inflammation. °What increases the risk? °The condition is more likely to develop in women who: °Take antibiotic medicines. °Have diabetes. °Take birth control pills. °Are pregnant. °Douche often. °Have a weak body defense system (immune system). °Have been taking steroid medicines for a long time. °Frequently wear tight clothing. °What are the signs or symptoms? °Symptoms of this condition include: °White, thick, creamy vaginal discharge. °Swelling, itching, redness, and irritation of the vagina. The lips of the vagina (labia) may be affected as well. °Pain or a burning feeling while urinating. °Pain during sex. °How is this diagnosed? °This condition is diagnosed based on: °Your medical history. °A physical exam. °A pelvic exam. Your health care provider will examine a sample of your vaginal discharge under a microscope. Your health care provider may send this sample for testing to confirm the diagnosis. °How is this treated? °This condition is treated with medicine. Medicines may be over-the-counter or prescription. You may be told to use one or more of the following: °Medicine that is taken by mouth (orally). °Medicine that is applied as a cream (topically). °Medicine that is inserted directly into the vagina (suppository). °Follow these instructions at home: °Take or apply over-the-counter and prescription medicines only as told by your health care provider. °Do not use tampons until your health care provider approves. °Do not have sex until your infection has  cleared. Sex can prolong or worsen your symptoms of infection. Ask your health care provider when it is safe to resume sexual activity. °Keep all follow-up visits. This is important. °How is this prevented? ° °Do not wear tight clothes, such as pantyhose or tight pants. °Wear breathable cotton underwear. °Do not use douches, perfumed soap, creams, or powders. °Wipe from front to back after using the toilet. °If you have diabetes, keep your blood sugar levels under control. °Ask your health care provider for other ways to prevent yeast infections. °Contact a health care provider if: °You have a fever. °Your symptoms go away and then return. °Your symptoms do not get better with treatment. °Your symptoms get worse. °You have new symptoms. °You develop blisters in or around your vagina. °You have blood coming from your vagina and it is not your menstrual period. °You develop pain in your abdomen. °Summary °Vaginal yeast infection is a condition that causes discharge as well as soreness, swelling, and redness (inflammation) of the vagina. °This condition is treated with medicine. Medicines may be over-the-counter or prescription. °Take or apply over-the-counter and prescription medicines only as told by your health care provider. °Do not douche. Resume sexual activity or use of tampons as instructed by your health care provider. °Contact a health care provider if your symptoms do not get better with treatment or your symptoms go away and then return. °This information is not intended to replace advice given to you by your health care provider. Make sure you discuss any questions you have with your health care provider. °Document Revised: 07/19/2020 Document Reviewed: 07/19/2020 °Elsevier Patient Education © 2022 [employer] ° °

## 2021-07-20 ENCOUNTER — Encounter (HOSPITAL_COMMUNITY): Payer: Self-pay

## 2021-07-20 ENCOUNTER — Other Ambulatory Visit: Payer: Self-pay

## 2021-07-20 ENCOUNTER — Ambulatory Visit (HOSPITAL_COMMUNITY)
Admission: RE | Admit: 2021-07-20 | Discharge: 2021-07-20 | Disposition: A | Discharge status: Home / Self Care | Payer: 59 | Source: Ambulatory Visit | Attending: Family Medicine | Admitting: Family Medicine

## 2021-07-20 VITALS — BP 113/72 | HR 65 | Temp 98.6°F | Resp 18

## 2021-07-20 DIAGNOSIS — N898 Other specified noninflammatory disorders of vagina: Secondary | ICD-10-CM | POA: Diagnosis not present

## 2021-07-20 DIAGNOSIS — Z113 Encounter for screening for infections with a predominantly sexual mode of transmission: Secondary | ICD-10-CM | POA: Insufficient documentation

## 2021-07-20 LAB — POCT URINALYSIS DIPSTICK, ED / UC
Bilirubin Urine: NEGATIVE
Glucose, UA: NEGATIVE mg/dL
Hgb urine dipstick: NEGATIVE
Leukocytes,Ua: NEGATIVE
Nitrite: NEGATIVE
Protein, ur: NEGATIVE mg/dL
Specific Gravity, Urine: 1.025 (ref 1.005–1.030)
Urobilinogen, UA: 0.2 mg/dL (ref 0.0–1.0)
pH: 5.5 (ref 5.0–8.0)

## 2021-07-20 LAB — HIV ANTIBODY (ROUTINE TESTING W REFLEX): HIV Screen 4th Generation wRfx: NONREACTIVE

## 2021-07-20 NOTE — ED Provider Notes (Signed)
?[facility] ? ? ? ?CSN: [id number] ?Arrival date & time: 07/20/21  1453 ? ? ?  ? ?History   ?Chief Complaint ?Chief Complaint  ?Patient presents with  ? SEXUALLY TRANSMITTED DISEASE  ? Vaginal Itching  ? ? ?HPI ?Suzanne Wolfe is a 26 y.o. female.  ? ?Patient is here for vaginal irritation x 1 week.  Having itchy.   It has improved somewhat.  No d/c noted, she just ended her period.   ?No known exposure to STD. She did have unprotected intercourse several weeks ago.   ?No abd pain or back pain.  No fever/chills.  ?She has noted a sore that she feels when she urinates or wipes;  hurts only a little bit.  ? ? ?Vaginal Itching ? ? ?Past Medical History:  ?Diagnosis Date  ? History of Clostridium difficile infection 02/ 2018  resolve w/ antibiotic  ? Mild asthma   ? Patellar instability of right knee   ? ? ?Patient Active Problem List  ? Diagnosis Date Noted  ? 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 Infected pilonidal cyst 10/22/2014  ? Pilonidal sinus 07/02/2014  ? Episodic tension-type headache, not intractable 12/04/2013  ? Migraine without aura and without status migrainosus, not intractable 12/04/2013  ? ? ?Past Surgical History:  ?Procedure Laterality Date  ? COLONOSCOPY  08-04-2016  dr stark  ? ESOPHAGOGASTRODUODENOSCOPY  06/20/2016  ? LAPAROSCOPIC APPENDECTOMY  07/16/2009  ? MEDIAL PATELLOFEMORAL LIGAMENT REPAIR Right 10/31/2016  ? Procedure: Right knee arthroscopic assisted medial patella femoral ligament reconstruction;  Surgeon: Yolonda Kida, MD;  Location: [facility];  Service: Orthopedics;  Laterality: Right;  ? PILONIDAL CYST EXCISION N/A 07/02/2014  ? Procedure: EXCISION OF PILONIDAL CYST WITH PRIMARY CLOSURE;  Surgeon: Judie Petit. Leonia Corona, MD;  Location: [facility];  Service: Pediatrics;  Laterality: N/A;  ? PILONIDAL CYST EXCISION N/A 10/22/2014  ? Procedure: EXCISION PILONIDAL CYST AND SINUS;  Surgeon: Leonia Corona, MD;  Location: [facility];  Service: Pediatrics;  Laterality: N/A;  Re-excision  ? WISDOM TOOTH EXTRACTION    ? ? ?OB History   ? ? Gravida  ?1  ? Para  ?   ? Term  ?   ? Preterm  ?   ? AB  ?   ? Living  ?   ?  ? ? SAB  ?   ? IAB  ?   ? Ectopic  ?   ? Multiple  ?   ? Live Births  ?   ?   ?  ?  ? ? ? ?Home Medications   ? ?Prior to Admission medications   ?Medication Sig Start Date End Date Taking? Authorizing Provider  ?acetaminophen (TYLENOL) 500 MG tablet Take 500 mg by mouth every 6 (six) hours as needed.    [provider]  ?albuterol (VENTOLIN HFA) 108 (90 Base) MCG/ACT inhaler Inhale 1-2 puffs into the lungs every 6 (six) hours as needed for wheezing or shortness of breath. 06/11/21   Rhys Martini, PA-C  ?ALBUTEROL IN Inhale into the lungs.    [provider]  ?dextromethorphan-guaiFENesin (MUCINEX DM) 30-600 MG 12hr tablet Take 1 tablet by mouth 2 (two) times daily as needed for cough. 06/11/21   Rhys Martini, PA-C  ?fluconazole (DIFLUCAN) 150 MG tablet Take 1 tablet (150 mg total) by mouth daily. May repeat in 3 days. 07/12/21   Freddy Finner, NP  ?promethazine-dextromethorphan (PROMETHAZINE-DM) 6.25-15 MG/5ML syrup Take 5  mLs by mouth 3 (three) times daily as needed for cough. 06/11/21   Rhys MartiniGraham, Laura E, PA-C  ? ? ?Family History ?Family History  ?Problem Relation Age of Onset  ? Healthy Mother   ? Asthma Father   ? Diabetes Maternal Grandmother   ? Hypertension Maternal Grandmother   ? Heart disease Maternal Grandmother   ?     hx. open heart surgery  ? Colon cancer Neg Hx   ? Esophageal cancer Neg Hx   ? ? ?Social History ?Social History  ? ?Tobacco Use  ? Smoking status: Never  ? Smokeless tobacco: Never  ?Vaping Use  ? Vaping Use: Never used  ?Substance Use Topics  ? Alcohol use: No  ? Drug use: No  ? ? ? ?Allergies   ?Patient has no known allergies. ? ? ?Review of Systems ?Review of Systems  ?Constitutional: Negative.   ?HENT: Negative.    ?Respiratory: Negative.    ?Cardiovascular:  Negative.   ?Gastrointestinal: Negative.   ? ? ?Physical Exam ?Triage Vital Signs ?ED Triage Vitals  ?Enc Vitals Group  ?   BP 07/20/21 1514 113/72  ?   Pulse Rate 07/20/21 1514 65  ?   Resp 07/20/21 1514 18  ?   Temp 07/20/21 1514 98.6 ?F (37 ?C)  ?   Temp Source 07/20/21 1514 Oral  ?   SpO2 07/20/21 1514 97 %  ?   Weight --   ?   Height --   ?   Head Circumference --   ?   Peak Flow --   ?   Pain Score 07/20/21 1513 0  ?   Pain Loc --   ?   Pain Edu? --   ?   Excl. in [place]? --   ? ?No data found. ? ?Updated Vital Signs ?BP 113/72 (BP Location: Left Arm)   Pulse 65   Temp 98.6 ?F (37 ?C) (Oral)   Resp 18   LMP 07/14/2021   SpO2 97%  ? ?Visual Acuity ?Right Eye Distance:   ?Left Eye Distance:   ?Bilateral Distance:   ? ?Right Eye Near:   ?Left Eye Near:    ?Bilateral Near:    ? ?Physical Exam ?Constitutional:   ?   Appearance: Normal appearance.  ?Cardiovascular:  ?   Rate and Rhythm: Normal rate and regular rhythm.  ?Pulmonary:  ?   Effort: Pulmonary effort is normal.  ?   Breath sounds: Normal breath sounds.  ?Genitourinary: ?   Comments: No vaginal d/c noted;  slight scratch on the left just below the vagina;  no ulceration noted. ?Neurological:  ?   General: No focal deficit present.  ?   Mental Status: She is alert and oriented to person, place, and time.  ?Psychiatric:     ?   Mood and Affect: Mood normal.     ?   Behavior: Behavior normal.  ? ? ? ?UC Treatments / Results  ?Labs ?(all labs ordered are listed, but only abnormal results are displayed) ?Labs Reviewed  ?HIV ANTIBODY (ROUTINE TESTING W REFLEX)  ?RPR  ?CERVICOVAGINAL ANCILLARY ONLY  ? ? ?EKG ? ? ?Radiology ?No results found. ? ?Procedures ?Procedures (including critical care time) ? ?Medications Ordered in UC ?Medications - No data to display ? ?Initial Impression / Assessment and Plan / UC Course  ?I have reviewed the triage vital signs and the nursing notes. ? ?Pertinent labs & imaging results that were available during my care of the patient  were reviewed by me and considered in my medical decision making (see chart for details). ? ?  ?Final Clinical Impressions(s) / UC Diagnoses  ? ?Final diagnoses:  ?Vaginal itching  ?Screening for STD (sexually transmitted disease)  ? ? ? ?Discharge Instructions   ? ?  ?You were seen today for vaginal irritation.  We will not treat with anything today, but will await results.  The genital swab and lab work will be resulted tomorrow.  If treatment is needed we will call and notify you.  ? ? ? ? ?ED Prescriptions   ?None ?  ? ?PDMP not reviewed this encounter. ?  Jannifer Franklin, MD ?07/20/21 1532 ? ?

## 2021-07-20 NOTE — Discharge Instructions (Addendum)
You were seen today for vaginal irritation.  We will not treat with anything today, but will await results.  The genital swab and lab work will be resulted tomorrow.  If treatment is needed we will call and notify you.  ? ?

## 2021-07-20 NOTE — ED Triage Notes (Signed)
Pt c/o vaginal irritation (itching) since 07/11/2021. ?Pt requesting to be tested for STDs. ?

## 2021-07-21 ENCOUNTER — Telehealth (HOSPITAL_COMMUNITY): Payer: Self-pay | Admitting: Emergency Medicine

## 2021-07-21 LAB — CERVICOVAGINAL ANCILLARY ONLY
Bacterial Vaginitis (gardnerella): POSITIVE — AB
Candida Glabrata: NEGATIVE
Candida Vaginitis: NEGATIVE
Chlamydia: NEGATIVE
Comment: NEGATIVE
Comment: NEGATIVE
Comment: NEGATIVE
Comment: NEGATIVE
Comment: NEGATIVE
Comment: NORMAL
Neisseria Gonorrhea: NEGATIVE
Trichomonas: NEGATIVE

## 2021-07-21 LAB — RPR: RPR Ser Ql: NONREACTIVE

## 2021-07-21 MED ORDER — METRONIDAZOLE 500 MG PO TABS: 500.0000 mg | ORAL_TABLET | Freq: Two times a day (BID) | ORAL | 0 refills | Status: DC

## 2021-08-15 ENCOUNTER — Telehealth: Payer: 59 | Admitting: Physician Assistant

## 2021-08-15 DIAGNOSIS — T3695XA Adverse effect of unspecified systemic antibiotic, initial encounter: Secondary | ICD-10-CM | POA: Diagnosis not present

## 2021-08-15 DIAGNOSIS — B379 Candidiasis, unspecified: Secondary | ICD-10-CM

## 2021-08-15 MED ORDER — FLUCONAZOLE 150 MG PO TABS
150.0000 mg | ORAL_TABLET | Freq: Once | ORAL | 0 refills | Status: AC
Start: 2021-08-15 — End: 2021-08-15

## 2021-08-15 NOTE — Patient Instructions (Signed)
?Lennox Grumbles, thank you for joining Mar Daring, PA-C for today's virtual visit.  While this provider is not your primary care provider (PCP), if your PCP is located in our provider database this encounter information will be shared with them immediately following your visit. ? ?Consent: ?(Patient) Suzanne Wolfe provided verbal consent for this virtual visit at the beginning of the encounter. ? ?Current Medications: ? ?Current Outpatient Medications:  ?  fluconazole (DIFLUCAN) 150 MG tablet, Take 1 tablet (150 mg total) by mouth once for 1 dose. May repeat in 72 hours if needed, Disp: 2 tablet, Rfl: 0 ?  metroNIDAZOLE (FLAGYL) 500 MG tablet, Take 1 tablet (500 mg total) by mouth 2 (two) times daily., Disp: 14 tablet, Rfl: 0 ?  acetaminophen (TYLENOL) 500 MG tablet, Take 500 mg by mouth every 6 (six) hours as needed., Disp: , Rfl:  ?  albuterol (VENTOLIN HFA) 108 (90 Base) MCG/ACT inhaler, Inhale 1-2 puffs into the lungs every 6 (six) hours as needed for wheezing or shortness of breath., Disp: 1 each, Rfl: 0 ?  ALBUTEROL IN, Inhale into the lungs., Disp: , Rfl:   ? ?Medications ordered in this encounter:  ?Meds ordered this encounter  ?Medications  ? fluconazole (DIFLUCAN) 150 MG tablet  ?  Sig: Take 1 tablet (150 mg total) by mouth once for 1 dose. May repeat in 72 hours if needed  ?  Dispense:  2 tablet  ?  Refill:  0  ?  Order Specific Question:   Supervising Provider  ?  Answer:   Noemi Chapel [[id number]]  ?  ? ?*If you need refills on other medications prior to your next appointment, please contact your pharmacy* ? ?Follow-Up: ?Call back or seek an in-person evaluation if the symptoms worsen or if the condition fails to improve as anticipated. ? ?Other Instructions ?Vaginal Yeast Infection, Adult ?Vaginal yeast infection is a condition that causes vaginal discharge as well as soreness, swelling, and redness (inflammation) of the vagina. This is a common condition. Some women get this infection  frequently. ?What are the causes? ?This condition is caused by a change in the normal balance of the yeast (Candida) and normal bacteria that live in the vagina. This change causes an overgrowth of yeast, which causes the inflammation. ?What increases the risk? ?The condition is more likely to develop in women who: ?Take antibiotic medicines. ?Have diabetes. ?Take birth control pills. ?Are pregnant. ?Douche often. ?Have a weak body defense system (immune system). ?Have been taking steroid medicines for a long time. ?Frequently wear tight clothing. ?What are the signs or symptoms? ?Symptoms of this condition include: ?White, thick, creamy vaginal discharge. ?Swelling, itching, redness, and irritation of the vagina. The lips of the vagina (labia) may be affected as well. ?Pain or a burning feeling while urinating. ?Pain during sex. ?How is this diagnosed? ?This condition is diagnosed based on: ?Your medical history. ?A physical exam. ?A pelvic exam. Your health care provider will examine a sample of your vaginal discharge under a microscope. Your health care provider may send this sample for testing to confirm the diagnosis. ?How is this treated? ?This condition is treated with medicine. Medicines may be over-the-counter or prescription. You may be told to use one or more of the following: ?Medicine that is taken by mouth (orally). ?Medicine that is applied as a cream (topically). ?Medicine that is inserted directly into the vagina (suppository). ?Follow these instructions at home: ?Take or apply over-the-counter and prescription medicines only as told by your  health care provider. ?Do not use tampons until your health care provider approves. ?Do not have sex until your infection has cleared. Sex can prolong or worsen your symptoms of infection. Ask your health care provider when it is safe to resume sexual activity. ?Keep all follow-up visits. This is important. ?How is this prevented? ? ?Do not wear tight clothes,  such as pantyhose or tight pants. ?Wear breathable cotton underwear. ?Do not use douches, perfumed soap, creams, or powders. ?Wipe from front to back after using the toilet. ?If you have diabetes, keep your blood sugar levels under control. ?Ask your health care provider for other ways to prevent yeast infections. ?Contact a health care provider if: ?You have a fever. ?Your symptoms go away and then return. ?Your symptoms do not get better with treatment. ?Your symptoms get worse. ?You have new symptoms. ?You develop blisters in or around your vagina. ?You have blood coming from your vagina and it is not your menstrual period. ?You develop pain in your abdomen. ?Summary ?Vaginal yeast infection is a condition that causes discharge as well as soreness, swelling, and redness (inflammation) of the vagina. ?This condition is treated with medicine. Medicines may be over-the-counter or prescription. ?Take or apply over-the-counter and prescription medicines only as told by your health care provider. ?Do not douche. Resume sexual activity or use of tampons as instructed by your health care provider. ?Contact a health care provider if your symptoms do not get better with treatment or your symptoms go away and then return. ?This information is not intended to replace advice given to you by your health care provider. Make sure you discuss any questions you have with your health care provider. ?Document Revised: 07/19/2020 Document Reviewed: 07/19/2020 ?Elsevier Patient Education ? 2022 [employer]. ? ? ? ?If you have been instructed to have an in-person evaluation today at a local Urgent Care facility, please use the link below. It will take you to a list of all of our available [facility], including address, phone number and hours of operation. Please do not delay care.  ?[facility] ? ?If you or a family member do not have a primary care provider, use the link below to schedule a visit and  establish care. When you choose a [facility] primary care physician or advanced practice provider, you gain a long-term partner in health. ?Find a Primary Care Provider ? ?Learn more about [facility]'s in-office and virtual care options: ?[facility] ?

## 2021-08-15 NOTE — Progress Notes (Signed)
?Virtual Visit Consent  ? ?Suzanne Wolfe, you are scheduled for a virtual visit with a [facility] provider today.   ?  ?Just as with appointments in the office, your consent must be obtained to participate.  Your consent will be active for this visit and any virtual visit you may have with one of our providers in the next 365 days.   ?  ?If you have a MyChart account, a copy of this consent can be sent to you electronically.  All virtual visits are billed to your insurance company just like a traditional visit in the office.   ? ?As this is a virtual visit, video technology does not allow for your provider to perform a traditional examination.  This may limit your provider's ability to fully assess your condition.  If your provider identifies any concerns that need to be evaluated in person or the need to arrange testing (such as labs, EKG, etc.), we will make arrangements to do so.   ?  ?Although advances in technology are sophisticated, we cannot ensure that it will always work on either your end or our end.  If the connection with a video visit is poor, the visit may have to be switched to a telephone visit.  With either a video or telephone visit, we are not always able to ensure that we have a secure connection.    ? ?I need to obtain your verbal consent now.   Are you willing to proceed with your visit today?  ?  ?Suzanne Wolfe has provided verbal consent on 08/15/2021 for a virtual visit (video or telephone). ?  ?Mar Daring, PA-C  ? ?Date: 08/15/2021 6:24 PM ? ? ?Virtual Visit via Video Note  ? ?Suzanne Wolfe, connected with  Suzanne Wolfe  ([id number], 09-Feb-1996) on 08/15/21 at  6:15 PM EDT by a video-enabled telemedicine application and verified that I am speaking with the correct person using two identifiers. ? ?Location: ?Patient: Virtual Visit Location Patient: Home ?Provider: Virtual Visit Location Provider: Home Office ?  ?I discussed the limitations of evaluation and management by  telemedicine and the availability of in person appointments. The patient expressed understanding and agreed to proceed.   ? ?History of Present Illness: ?Suzanne Wolfe is a 26 y.o. who identifies as a female who was assigned female at birth, and is being seen today for vaginal irritation. ? ?HPI: Vaginal Discharge ?The patient's primary symptoms include genital itching and vaginal discharge. The patient's pertinent negatives include no genital odor or genital rash. This is a new problem. The current episode started in the past 7 days (3 days ago). The problem occurs constantly. The problem has been gradually worsening. The patient is experiencing no pain. Pertinent negatives include no abdominal pain, back pain, chills, dysuria, fever, flank pain or nausea. The vaginal discharge was watery and thick. There has been no bleeding. Exacerbated by: recent antibiotic. She has tried nothing for the symptoms. The treatment provided no relief.   ? ? ?Problems:  ?Patient Active Problem List  ? Diagnosis Date Noted  ? 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 Infected pilonidal cyst 10/22/2014  ? Pilonidal sinus 07/02/2014  ? Episodic tension-type headache, not intractable 12/04/2013  ? Migraine without aura and without status migrainosus, not intractable 12/04/2013  ?  ?Allergies: No Known Allergies ?Medications:  ?Current Outpatient Medications:  ?  fluconazole (DIFLUCAN) 150 MG tablet, Take 1 tablet (150 mg total) by mouth once for 1 dose. May repeat in  72 hours if needed, Disp: 2 tablet, Rfl: 0 ?  metroNIDAZOLE (FLAGYL) 500 MG tablet, Take 1 tablet (500 mg total) by mouth 2 (two) times daily., Disp: 14 tablet, Rfl: 0 ?  acetaminophen (TYLENOL) 500 MG tablet, Take 500 mg by mouth every 6 (six) hours as needed., Disp: , Rfl:  ?  albuterol (VENTOLIN HFA) 108 (90 Base) MCG/ACT inhaler, Inhale 1-2 puffs into the lungs every 6 (six) hours as needed for wheezing or shortness of breath., Disp: 1 each, Rfl: 0 ?  ALBUTEROL  IN, Inhale into the lungs., Disp: , Rfl:  ? ?Observations/Objective: ?Patient is well-developed, well-nourished in no acute distress.  ?Resting comfortably at home.  ?Head is normocephalic, atraumatic.  ?No labored breathing.  ?Speech is clear and coherent with logical content.  ?Patient is alert and oriented at baseline.  ? ? ?Assessment and Plan: ?1. Antibiotic-induced yeast infection ?- fluconazole (DIFLUCAN) 150 MG tablet; Take 1 tablet (150 mg total) by mouth once for 1 dose. May repeat in 72 hours if needed  Dispense: 2 tablet; Refill: 0 ? ?- Diflucan prescribed for yeast symptoms following metronidazole ?- Seek in person evaluation if not improving or if symptoms worsen ? ?Follow Up Instructions: ?I discussed the assessment and treatment plan with the patient. The patient was provided an opportunity to ask questions and all were answered. The patient agreed with the plan and demonstrated an understanding of the instructions.  A copy of instructions were sent to the patient via MyChart unless otherwise noted below.  ? ? ?The patient was advised to call back or seek an in-person evaluation if the symptoms worsen or if the condition fails to improve as anticipated. ? ?Time:  ?I spent 8 minutes with the patient via telehealth technology discussing the above problems/concerns.   ? ?Mar Daring, PA-C ?

## 2021-08-25 ENCOUNTER — Encounter (HOSPITAL_COMMUNITY): Payer: Self-pay

## 2021-08-25 ENCOUNTER — Ambulatory Visit (HOSPITAL_COMMUNITY)
Admission: RE | Admit: 2021-08-25 | Discharge: 2021-08-25 | Disposition: A | Discharge status: Home / Self Care | Payer: PRIVATE HEALTH INSURANCE | Source: Ambulatory Visit | Attending: Internal Medicine | Admitting: Internal Medicine

## 2021-08-25 VITALS — BP 120/79 | HR 93 | Temp 98.5°F | Resp 18

## 2021-08-25 DIAGNOSIS — M546 Pain in thoracic spine: Secondary | ICD-10-CM

## 2021-08-25 DIAGNOSIS — M545 Low back pain, unspecified: Secondary | ICD-10-CM | POA: Diagnosis not present

## 2021-08-25 DIAGNOSIS — S3992XA Unspecified injury of lower back, initial encounter: Secondary | ICD-10-CM

## 2021-08-25 MED ORDER — PREDNISONE 10 MG (21) PO TBPK: ORAL_TABLET | ORAL | 0 refills | Status: DC

## 2021-08-25 MED ORDER — TIZANIDINE HCL 4 MG PO TABS: 4.0000 mg | ORAL_TABLET | Freq: Three times a day (TID) | ORAL | 0 refills | Status: DC | PRN

## 2021-08-25 NOTE — ED Triage Notes (Signed)
Pt is present today with left side lower back pain and right side shoulder pain. Pt sx started x2 days ago  ?

## 2021-08-25 NOTE — ED Provider Notes (Signed)
?[facility] ? ? ? ?CSN: [id number] ?Arrival date & time: 08/25/21  1755 ? ? ?  ? ?History   ?Chief Complaint ?Chief Complaint  ?Patient presents with  ? Back Pain  ? Shoulder Pain  ? ? ?HPI ?Suzanne Wolfe is a 26 y.o. female.  ? ?Patient presents today with a 2-day history of right thoracic and left lumbar back pain following injury.  Reports that she found patient down and was trying to lift and assist them when she developed pain.  She initially had a mild discomfort in her back after the episode but this is significantly worsened several hours later when she was at home.  Pain is rated 5 when at rest but increases to 9 with palpation or certain movements, described as aching with periodic sharp pain, no aggravating or alleviating factors identified.  She denies previous back injury or surgery.  Denies any bowel/bladder incontinence, lower extremity weakness, saddle anesthesia.  She has tried ibuprofen without improvement of symptoms.  She is confident that she is not pregnant.  She has taken prednisone before and done well with this; denies history of diabetes.  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 ? ? ?Past Medical History:  ?Diagnosis Date  ? History of Clostridium difficile infection 02/ 2018  resolve w/ antibiotic  ? Mild asthma   ? Patellar instability of right knee   ? ? ?Patient Active Problem List  ? Diagnosis Date Noted  ? 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 Infected pilonidal cyst 10/22/2014  ? Pilonidal sinus 07/02/2014  ? Episodic tension-type headache, not intractable 12/04/2013  ? Migraine without aura and without status migrainosus, not intractable 12/04/2013  ? ? ?Past Surgical History:  ?Procedure Laterality Date  ? COLONOSCOPY  08-04-2016  dr stark  ? ESOPHAGOGASTRODUODENOSCOPY  06/20/2016  ? LAPAROSCOPIC APPENDECTOMY  07/16/2009  ?  MEDIAL PATELLOFEMORAL LIGAMENT REPAIR Right 10/31/2016  ? Procedure: Right knee arthroscopic assisted medial patella femoral ligament reconstruction;  Surgeon: Yolonda Kidaogers, Jason Patrick, MD;  [facility];  Service: Orthopedics;  Laterality: Right;  ? PILONIDAL CYST EXCISION N/A 07/02/2014  ? Procedure: EXCISION OF PILONIDAL CYST WITH PRIMARY CLOSURE;  Surgeon: Judie PetitM. Leonia CoronaShuaib Farooqui, MD;  Location: [facility];  Service: Pediatrics;  Laterality: N/A;  ? PILONIDAL CYST EXCISION N/A 10/22/2014  ? Procedure: EXCISION PILONIDAL CYST AND SINUS;  Surgeon: Leonia CoronaShuaib Farooqui, MD;  Location: [facility];  Service: Pediatrics;  Laterality: N/A;  Re-excision  ? WISDOM TOOTH EXTRACTION    ? ? ?OB History   ? ? Gravida  ?1  ? Para  ?   ? Term  ?   ? Preterm  ?   ? AB  ?   ? Living  ?   ?  ? ? SAB  ?   ? IAB  ?   ? Ectopic  ?   ? Multiple  ?   ? Live Births  ?   ?   ?  ?  ? ? ? ?Home Medications   ? ?Prior to Admission medications   ?Medication Sig Start Date End Date Taking? Authorizing Provider  ?metroNIDAZOLE (FLAGYL) 500 MG tablet Take 1 tablet (500 mg total) by mouth 2 (two) times daily. 07/21/21   Merrilee JanskyLamptey, Philip O, MD  ?predniSONE (STERAPRED UNI-PAK 21 TAB) 10 MG (21) TBPK tablet As directed 08/25/21  Yes  Zerrick Hanssen K, PA-C  ?tiZANidine (ZANAFLEX) 4 MG tablet Take 1 tablet (4 mg total) by mouth every 8 (eight) hours as needed for muscle spasms. 08/25/21  Yes Gerald Honea, Denny Peon K, PA-C  ?acetaminophen (TYLENOL) 500 MG tablet Take 500 mg by mouth every 6 (six) hours as needed.    [provider]  ?albuterol (VENTOLIN HFA) 108 (90 Base) MCG/ACT inhaler Inhale 1-2 puffs into the lungs every 6 (six) hours as needed for wheezing or shortness of breath. 06/11/21   Rhys Martini, PA-C  ?ALBUTEROL IN Inhale into the lungs.    [provider]  ? ? ?Family History ?Family History  ?Problem Relation Age of Onset  ? Healthy Mother   ? Asthma Father   ? Diabetes Maternal Grandmother   ?  Hypertension Maternal Grandmother   ? Heart disease Maternal Grandmother   ?     hx. open heart surgery  ? Colon cancer Neg Hx   ? Esophageal cancer Neg Hx   ? ? ?Social History ?Social History  ? ?Tobacco Use  ? Smoking status: Never  ? Smokeless tobacco: Never  ?Vaping Use  ? Vaping Use: Never used  ?Substance Use Topics  ? Alcohol use: No  ? Drug use: No  ? ? ? ?Allergies   ?Patient has no known allergies. ? ? ?Review of Systems ?Review of Systems  ?Constitutional:  Positive for activity change. Negative for appetite change, fatigue and fever.  ?Respiratory:  Negative for cough and shortness of breath.   ?Cardiovascular:  Negative for chest pain.  ?Gastrointestinal:  Negative for abdominal pain, diarrhea, nausea and vomiting.  ?Genitourinary:  Negative for dysuria, frequency, urgency, vaginal bleeding, vaginal discharge and vaginal pain.  ?Musculoskeletal:  Positive for back pain. Negative for arthralgias and myalgias.  ?Neurological:  Negative for dizziness, weakness, light-headedness, numbness and headaches.  ? ? ?Physical Exam ?Triage Vital Signs ?ED Triage Vitals  ?Enc Vitals Group  ?   BP 08/25/21 1840 120/79  ?   Pulse Rate 08/25/21 1840 93  ?   Resp 08/25/21 1840 18  ?   Temp 08/25/21 1840 98.5 ?F (36.9 ?C)  ?   Temp Source 08/25/21 1840 Oral  ?   SpO2 08/25/21 1840 100 %  ?   Weight --   ?   Height --   ?   Head Circumference --   ?   Peak Flow --   ?   Pain Score 08/25/21 1839 8  ?   Pain Loc --   ?   Pain Edu? --   ?   Excl. in GC? --   ? ?No data found. ? ?Updated Vital Signs ?BP 120/79   Pulse 93   Temp 98.5 ?F (36.9 ?C) (Oral)   Resp 18   SpO2 100%  ? ?Visual Acuity ?Right Eye Distance:   ?Left Eye Distance:   ?Bilateral Distance:   ? ?Right Eye Near:   ?Left Eye Near:    ?Bilateral Near:    ? ?Physical Exam ?Vitals reviewed.  ?Constitutional:   ?   General: She is awake. She is not in acute distress. ?   Appearance: Normal appearance. She is well-developed. She is not ill-appearing.  ?    Comments: Very pleasant female appears stated age in no acute distress sitting comfortably in exam room  ?HENT:  ?   Head: Normocephalic and atraumatic.  ?Cardiovascular:  ?   Rate and Rhythm: Normal rate and regular rhythm.  ?   Heart  sounds: Normal heart sounds, S1 normal and S2 normal. No murmur heard. ?Pulmonary:  ?   Effort: Pulmonary effort is normal.  ?   Breath sounds: Normal breath sounds. No wheezing, rhonchi or rales.  ?   Comments: Clear to auscultation bilaterally ?Abdominal:  ?   General: Bowel sounds are normal.  ?   Palpations: Abdomen is soft.  ?   Tenderness: There is no abdominal tenderness. There is no right CVA tenderness, left CVA tenderness, guarding or rebound.  ?   Comments: Benign abdominal exam  ?Musculoskeletal:  ?   Cervical back: No tenderness or bony tenderness.  ?   Thoracic back: Tenderness present. No spasms or bony tenderness.  ?   Lumbar back: Tenderness present. No bony tenderness.  ?     Back: ? ?   Comments: Back: No pain with percussion of vertebrae on exam.  Tenderness to palpation of right thoracic and left lumbar paraspinal muscles.  Strength 5/5 bilateral lower extremities.  Decreased range of motion with rotation, forward flexion, extension.  ?Psychiatric:     ?   Behavior: Behavior is cooperative.  ? ? ? ?UC Treatments / Results  ?Labs ?(all labs ordered are listed, but only abnormal results are displayed) ?Labs Reviewed - No data to display ? ?EKG ? ? ?Radiology ?No results found. ? ?Procedures ?Procedures (including critical care time) ? ?Medications Ordered in UC ?Medications - No data to display ? ?Initial Impression / Assessment and Plan / UC Course  ?I have reviewed the triage vital signs and the nursing notes. ? ?Pertinent labs & imaging results that were available during my care of the patient were reviewed by me and considered in my medical decision making (see chart for details). ? ?  ? ?No indication for plain films as patient has no bony tenderness and denies  any significant trauma; denies any red flag symptoms that warrant emergent evaluation or imaging.  Discussed likely muscle strain related to recent injury.  She has failed NSAID therapy so we will try prednisone ta

## 2021-08-25 NOTE — Discharge Instructions (Signed)
I am concerned that you injured yourself when trying to lift the patient.  You likely have a muscle strain.  Please start prednisone as prescribed.  Do not take NSAIDs including aspirin, ibuprofen/Advil, naproxen/Aleve with this medication as it can cause stomach bleeding.  You can use Tylenol for breakthrough pain.  Use heat and gentle stretch for symptom relief.  I have also called in a muscle relaxer known as tizanidine.  You can take this up to 3 times a day but this will make you sleepy so do not drive or drink alcohol with taking it.  If your symptoms not improving quickly please follow-up with sports medicine for further evaluation.  If at any point anything worsens and you have severe pain, fever, nausea, vomiting, weakness in your legs, tingling sensation in your legs, numbness, going to the bathroom on yourself without noticing it you need to go to the emergency room immediately. ?

## 2021-09-21 ENCOUNTER — Ambulatory Visit (HOSPITAL_BASED_OUTPATIENT_CLINIC_OR_DEPARTMENT_OTHER): Payer: 59 | Admitting: Critical Care Medicine

## 2021-09-21 DIAGNOSIS — Z91199 Patient's noncompliance with other medical treatment and regimen due to unspecified reason: Secondary | ICD-10-CM

## 2021-09-21 NOTE — Progress Notes (Deleted)
? ?New Patient Office Visit ? ?Subjective   ? ?Patient ID: Suzanne Wolfe, female    DOB: 23-Sep-1995  Age: 26 y.o. MRN: [id number] ? ?CC: No chief complaint on file. ? ? ?HPI ?Suzanne Wolfe presents to establish care ?Vag, back pp hcv ? ? ?Outpatient Encounter Medications as of 09/21/2021  ?Medication Sig  ?? metroNIDAZOLE (FLAGYL) 500 MG tablet Take 1 tablet (500 mg total) by mouth 2 (two) times daily.  ?? acetaminophen (TYLENOL) 500 MG tablet Take 500 mg by mouth every 6 (six) hours as needed.  ?? albuterol (VENTOLIN HFA) 108 (90 Base) MCG/ACT inhaler Inhale 1-2 puffs into the lungs every 6 (six) hours as needed for wheezing or shortness of breath.  ?? ALBUTEROL IN Inhale into the lungs.  ?? predniSONE (STERAPRED UNI-PAK 21 TAB) 10 MG (21) TBPK tablet As directed  ?? tiZANidine (ZANAFLEX) 4 MG tablet Take 1 tablet (4 mg total) by mouth every 8 (eight) hours as needed for muscle spasms.  ? ?No facility-administered encounter medications on file as of 09/21/2021.  ? ? ?Past Medical History:  ?Diagnosis Date  ?? History of Clostridium difficile infection 02/ 2018  resolve w/ antibiotic  ?? Mild asthma   ?? Patellar instability of right knee   ? ? ?Past Surgical History:  ?Procedure Laterality Date  ?? COLONOSCOPY  08-04-2016  dr stark  ?? ESOPHAGOGASTRODUODENOSCOPY  06/20/2016  ?? LAPAROSCOPIC APPENDECTOMY  07/16/2009  ?? MEDIAL PATELLOFEMORAL LIGAMENT REPAIR Right 10/31/2016  ? Procedure: Right knee arthroscopic assisted medial patella femoral ligament reconstruction;  Surgeon: Nicholes Stairs, MD;  Location: [facility];  Service: Orthopedics;  Laterality: Right;  ?? PILONIDAL CYST EXCISION N/A 07/02/2014  ? Procedure: EXCISION OF PILONIDAL CYST WITH PRIMARY CLOSURE;  Surgeon: Jerilynn Mages. Gerald Stabs, MD;  Location: [place];  Service: Pediatrics;  Laterality: N/A;  ?? PILONIDAL CYST EXCISION N/A 10/22/2014  ? Procedure: EXCISION PILONIDAL CYST AND SINUS;  Surgeon: Gerald Stabs, MD;   Location: [place];  Service: Pediatrics;  Laterality: N/A;  Re-excision  ?? WISDOM TOOTH EXTRACTION    ? ? ?Family History  ?Problem Relation Age of Onset  ?? Healthy Mother   ?? Asthma Father   ?? Diabetes Maternal Grandmother   ?? Hypertension Maternal Grandmother   ?? Heart disease Maternal Grandmother   ?     hx. open heart surgery  ?? Colon cancer Neg Hx   ?? Esophageal cancer Neg Hx   ? ? ?Social History  ? ?Socioeconomic History  ?? Marital status: Single  ?  Spouse name: Not on file  ?? Number of children: Not on file  ?? Years of education: Not on file  ?? Highest education level: Not on file  ?Occupational History  ?? Not on file  ?Tobacco Use  ?? Smoking status: Never  ?? Smokeless tobacco: Never  ?Vaping Use  ?? Vaping Use: Never used  ?Substance and Sexual Activity  ?? Alcohol use: No  ?? Drug use: No  ?? Sexual activity: Yes  ?  Birth control/protection: None  ?Other Topics Concern  ?? Not on file  ?Social History Narrative  ?? Not on file  ? ?Social Determinants of Health  ? ?Financial Resource Strain: Not on file  ?Food Insecurity: Not on file  ?Transportation Needs: Not on file  ?Physical Activity: Not on file  ?Stress: Not on file  ?Social Connections: Not on file  ?Intimate Partner Violence: Not on file  ? ? ?ROS ? ?  ? ? ?  Objective   ? ?There were no vitals taken for this visit. ? ?Physical Exam ? ?{Labs (Optional):23779} ?  ? ?Assessment & Plan:  ? ?Problem List Items Addressed This Visit   ?None ? ? ?No follow-ups on file.  ? ?Asencion Noble, MD ? ? ?

## 2021-09-21 NOTE — Progress Notes (Signed)
Pt was no show from UC visit   I called pt, no answer no VM  ?

## 2022-01-23 ENCOUNTER — Telehealth: Payer: Self-pay | Admitting: Physician Assistant

## 2022-01-23 DIAGNOSIS — B3731 Acute candidiasis of vulva and vagina: Secondary | ICD-10-CM

## 2022-01-23 MED ORDER — FLUCONAZOLE 150 MG PO TABS: 150.0000 mg | ORAL_TABLET | ORAL | 0 refills | Status: DC | PRN

## 2022-01-23 NOTE — Progress Notes (Signed)

## 2022-01-27 IMAGING — US US OB < 14 WEEKS - US OB TV
1 series · 15 of 28 positions shown · non-contrast
Comparison: None.

CLINICAL DATA: Vaginal bleeding

EXAM:
OBSTETRIC <14 WK US AND TRANSVAGINAL OB US
TECHNIQUE: Both transabdominal and transvaginal ultrasound examinations were
performed for complete evaluation of the gestation as well as the
maternal uterus, adnexal regions, and pelvic cul-de-sac.
Transvaginal technique was performed to assess early pregnancy.

[Series 1: us ob < 14 weeks - us ob tv · 15 of 60 slices shown]
[im 1/60]
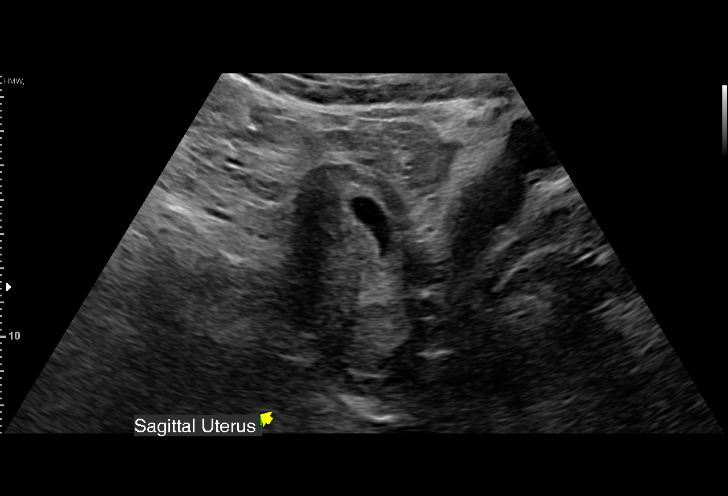
[im 5/60]
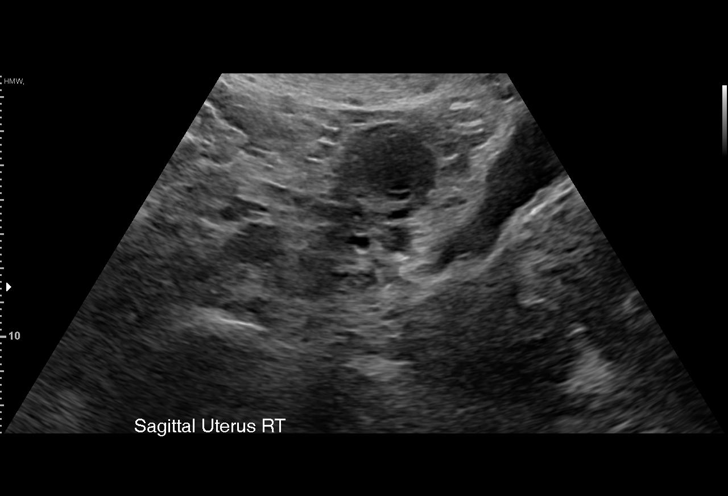
[im 9/60]
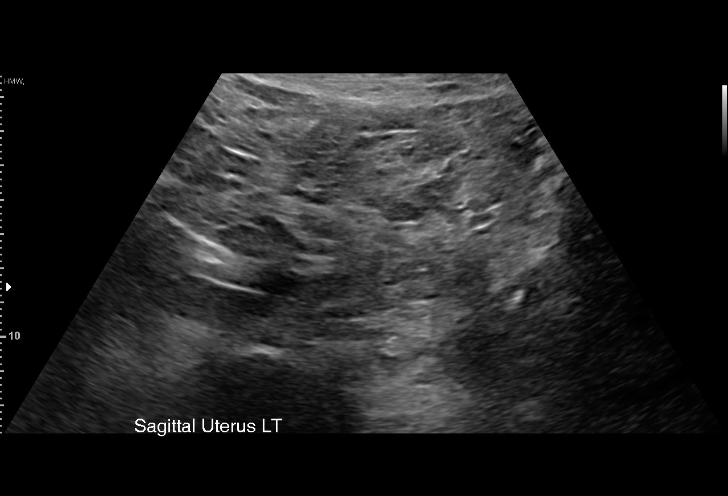
[im 14/60]
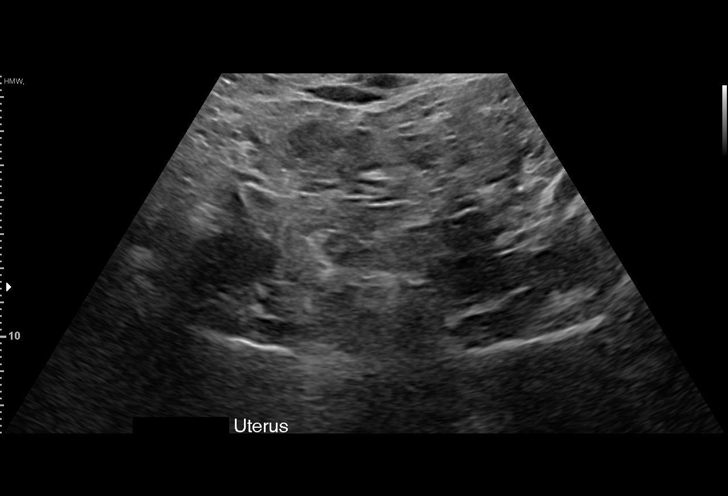
[im 18/60]
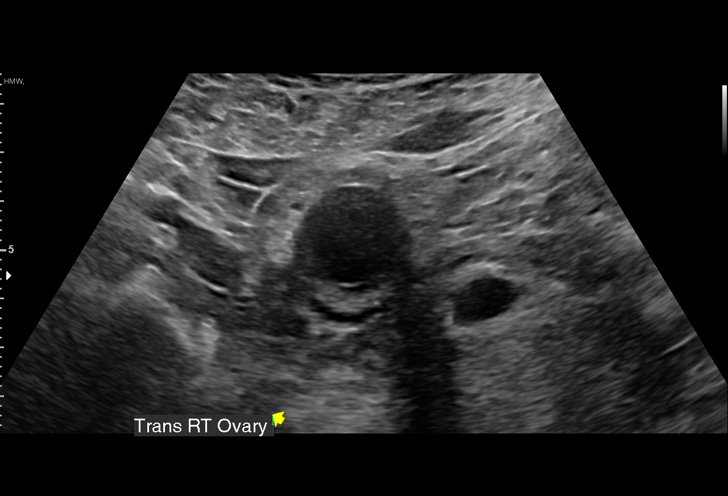
[im 22/60]
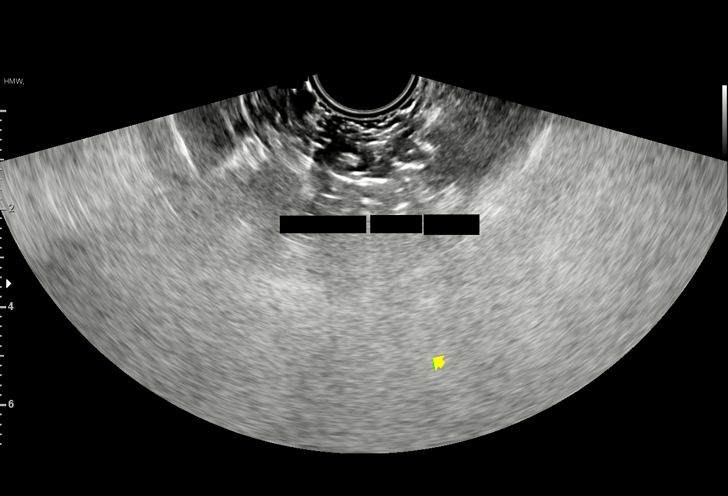
[im 27/60]
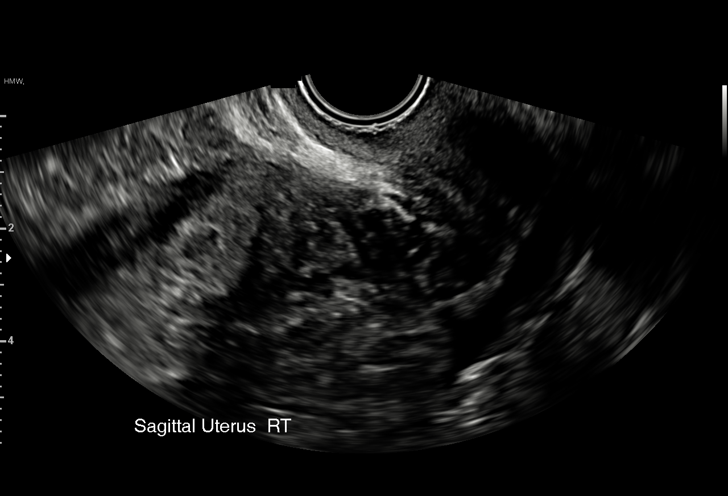
[im 31/60]
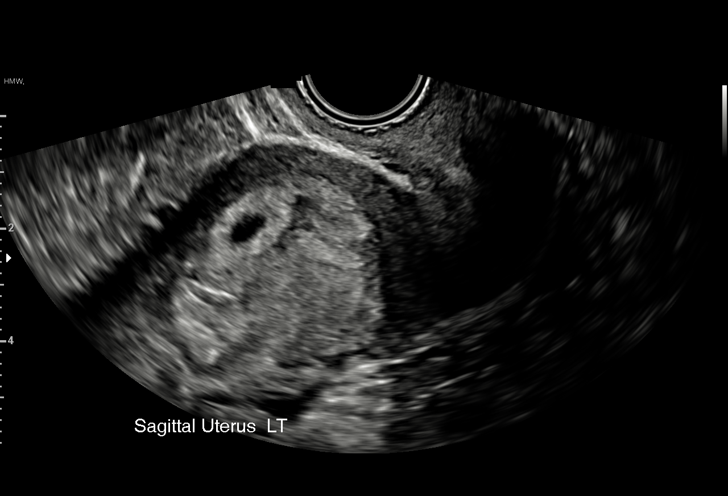
[im 33/60]
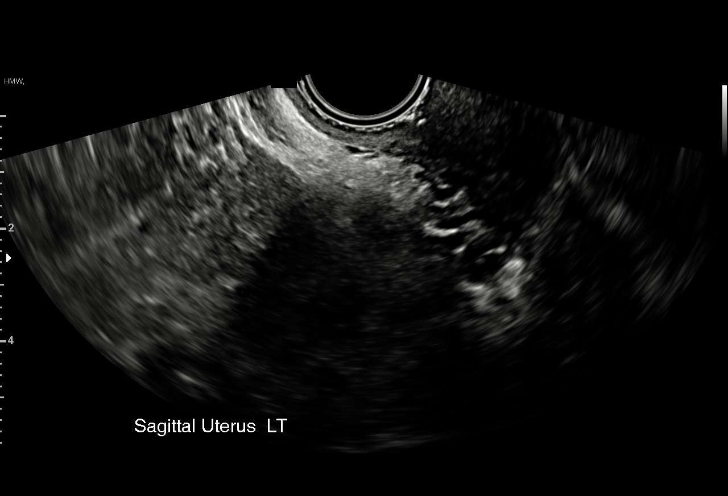
[im 38/60]
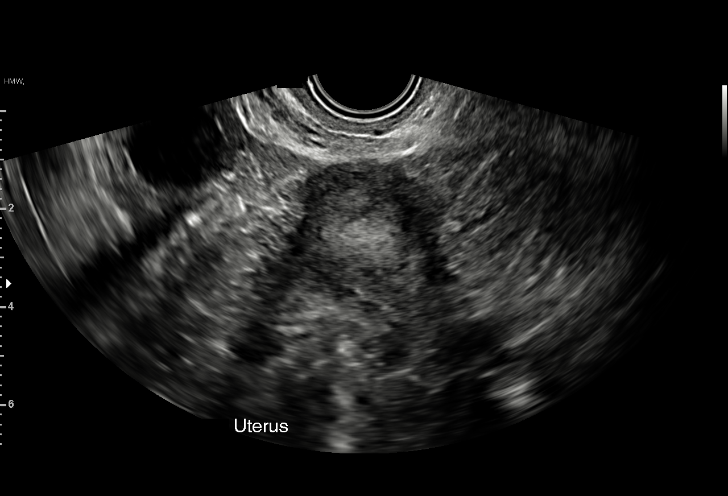
[im 42/60]
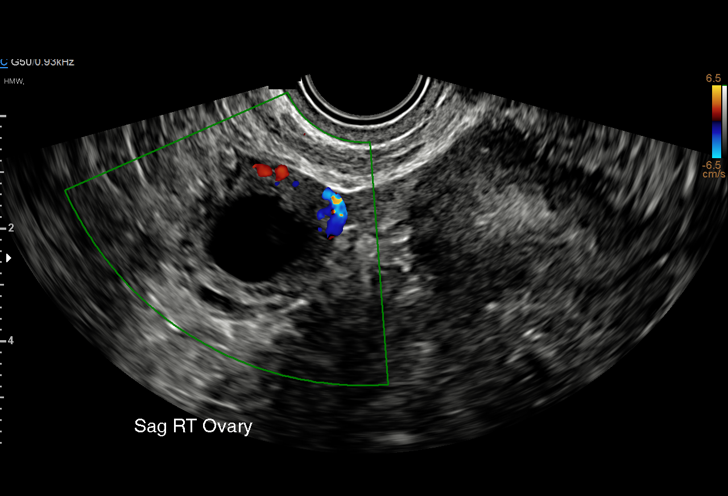
[im 46/60]
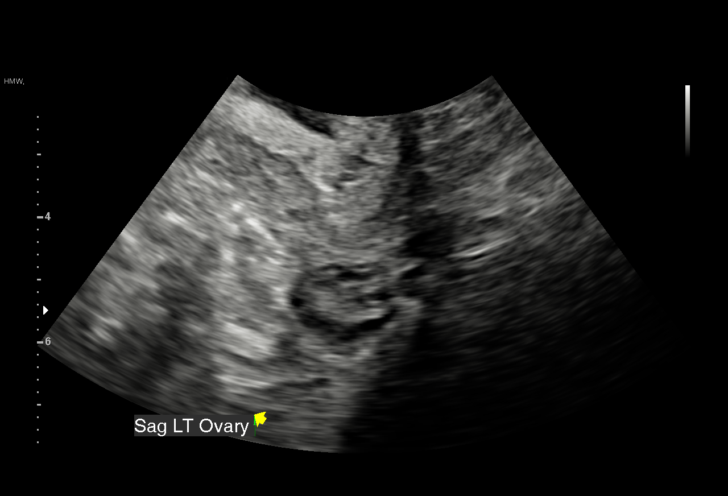
[im 51/60]
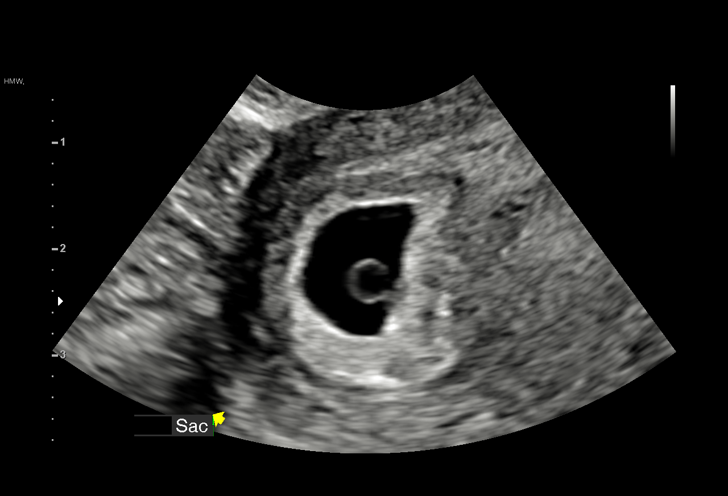
[im 55/60]
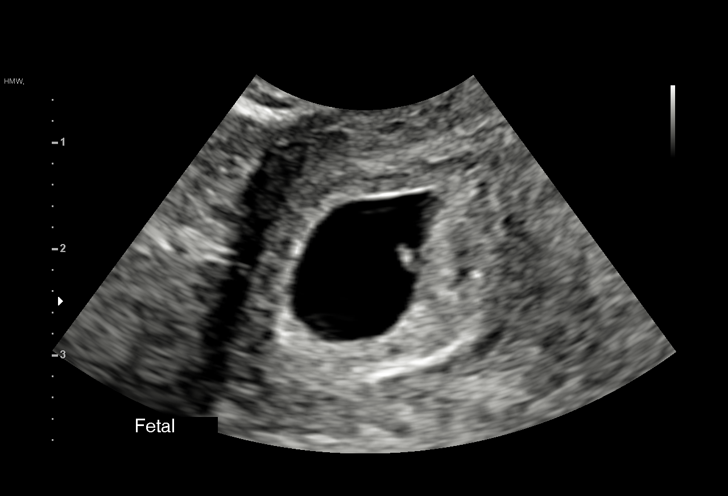
[im 60/60]
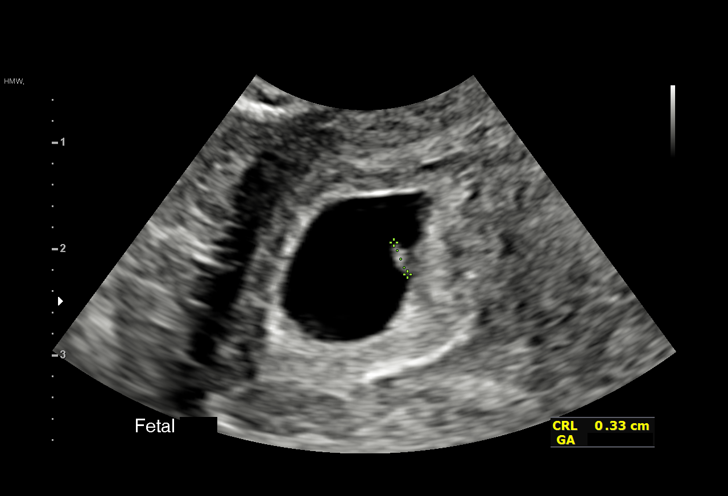

[15 of 28 positions shown; findings below may reference images not displayed]

FINDINGS: Intrauterine gestational sac: Single intrauterine gestational sac

Yolk sac:  Visualized

Embryo:  Visualized

Cardiac Activity: Visualized

Heart Rate: 104 bpm

CRL: 2.9 mm   5 w   5 d                  US EDC: 02/03/2021

Subchorionic hemorrhage:  None visualized.

Maternal uterus/adnexae: Ovaries are within normal limits. Left
ovary measures 1.8 x 1.4 x 1.8 cm. The right ovary measures 2.4 by
3.1 x 2.5 cm and contains corpus luteum. No significant free fluid
IMPRESSION: 1. Single viable intrauterine pregnancy as above.
2. No specific abnormality is seen

## 2022-04-20 ENCOUNTER — Ambulatory Visit (HOSPITAL_COMMUNITY): Admit: 2022-04-20 | Discharge status: Against Medical Advice | Payer: Commercial Managed Care - HMO

## 2022-04-21 ENCOUNTER — Encounter (HOSPITAL_COMMUNITY): Payer: Self-pay

## 2022-04-21 ENCOUNTER — Ambulatory Visit (HOSPITAL_COMMUNITY)
Admission: RE | Admit: 2022-04-21 | Discharge: 2022-04-21 | Disposition: A | Discharge status: Home / Self Care | Payer: Commercial Managed Care - HMO | Source: Ambulatory Visit | Attending: Internal Medicine | Admitting: Internal Medicine

## 2022-04-21 VITALS — BP 112/80 | HR 95 | Temp 98.8°F | Resp 16

## 2022-04-21 DIAGNOSIS — J111 Influenza due to unidentified influenza virus with other respiratory manifestations: Secondary | ICD-10-CM | POA: Diagnosis not present

## 2022-04-21 DIAGNOSIS — Z1152 Encounter for screening for COVID-19: Secondary | ICD-10-CM | POA: Insufficient documentation

## 2022-04-21 DIAGNOSIS — R197 Diarrhea, unspecified: Secondary | ICD-10-CM

## 2022-04-21 DIAGNOSIS — J4520 Mild intermittent asthma, uncomplicated: Secondary | ICD-10-CM | POA: Insufficient documentation

## 2022-04-21 DIAGNOSIS — Z7951 Long term (current) use of inhaled steroids: Secondary | ICD-10-CM | POA: Diagnosis not present

## 2022-04-21 DIAGNOSIS — E860 Dehydration: Secondary | ICD-10-CM | POA: Diagnosis not present

## 2022-04-21 DIAGNOSIS — R051 Acute cough: Secondary | ICD-10-CM | POA: Diagnosis present

## 2022-04-21 LAB — RESP PANEL BY RT-PCR (FLU A&B, COVID) ARPGX2
Influenza A by PCR: NEGATIVE
Influenza B by PCR: NEGATIVE
SARS Coronavirus 2 by RT PCR: NEGATIVE

## 2022-04-21 LAB — POC INFLUENZA A AND B ANTIGEN (URGENT CARE ONLY)
INFLUENZA A ANTIGEN, POC: NEGATIVE
INFLUENZA B ANTIGEN, POC: NEGATIVE

## 2022-04-21 MED ORDER — KETOROLAC TROMETHAMINE 30 MG/ML IJ SOLN
30.0000 mg | Freq: Once | INTRAMUSCULAR | Status: AC
  Administered 2022-04-21: 30 mg via INTRAVENOUS

## 2022-04-21 MED ORDER — SODIUM CHLORIDE 0.9 % IV BOLUS
1000.0000 mL | Freq: Once | INTRAVENOUS | Status: AC
  Administered 2022-04-21: 1000 mL via INTRAVENOUS

## 2022-04-21 MED ORDER — PROMETHAZINE-DM 6.25-15 MG/5ML PO SYRP: 5.0000 mL | ORAL_SOLUTION | Freq: Every evening | ORAL | 0 refills | Status: DC | PRN

## 2022-04-21 MED ORDER — BENZONATATE 100 MG PO CAPS: 100.0000 mg | ORAL_CAPSULE | Freq: Three times a day (TID) | ORAL | 0 refills | Status: DC

## 2022-04-21 MED ORDER — KETOROLAC TROMETHAMINE 30 MG/ML IJ SOLN
INTRAMUSCULAR | Status: AC
  Filled 2022-04-21: qty 1

## 2022-04-21 NOTE — ED Provider Notes (Signed)
[facility]    CSN: [id number] Arrival date & time: 04/21/22  1447      History   Chief Complaint Chief Complaint  Patient presents with   appt 3   Cough   Sore Throat    HPI Suzanne Wolfe is a 26 y.o. female.   Patient presents urgent care for evaluation of cough, fever/chills, sore throat, nasal congestion, headache, generalized fatigue, body aches, and diarrhea for the last 4 days.  Headache is worse with position changes and described as a throbbing sensation.  History of migraines, reports photophobia without blurry vision or decreased visual acuity.  Nasal congestion is thick, cough is sometimes productive sometimes not.  Had diarrhea for 2 days at beginning of illness, no longer experiencing diarrhea.  No blood or mucus to the stools.  No nausea, vomiting, heart palpitations, dizziness, shortness of breath, chest pain, or lightheadedness. No recent antibiotic or steroid use. History of asthma that is usually well-controlled by as needed use of albuterol.  Has been using nebulizer treatments at home with relief of shortness of breath, cough, and wheeze intermittently.  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  Took 2 COVID-19 tests at home over the last few days while sick and both of them were negative. Reports symptoms are getting worse. Has been taking over the counter cold and flu medications without relief of symptoms.    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/31/2016   Other constipation 06/28/2015   Rectal bleeding 06/28/2015   Infected pilonidal cyst 10/22/2014    Pilonidal sinus 07/02/2014   Episodic tension-type headache, not intractable 12/04/2013   Migraine without aura and without status migrainosus, not intractable 12/04/2013    Past Surgical History:  Procedure Laterality Date   COLONOSCOPY  08-04-2016  dr stark   ESOPHAGOGASTRODUODENOSCOPY  06/20/2016   LAPAROSCOPIC APPENDECTOMY  07/16/2009   MEDIAL PATELLOFEMORAL LIGAMENT REPAIR Right 10/31/2016   Procedure: Right knee arthroscopic assisted medial patella femoral ligament reconstruction;  Surgeon: Yolonda Kida, MD;  Location: [facility];  Service: Orthopedics;  Laterality: Right;   PILONIDAL CYST EXCISION N/A 07/02/2014   Procedure: EXCISION OF PILONIDAL CYST WITH PRIMARY CLOSURE;  Surgeon: Judie Petit. Leonia Corona, MD;  Location: [facility];  Service: Pediatrics;  Laterality: N/A;   PILONIDAL CYST EXCISION N/A 10/22/2014   Procedure: EXCISION PILONIDAL CYST AND SINUS;  Surgeon: Leonia Corona, MD;  Location: [facility];  Service: Pediatrics;  Laterality: N/A;  Re-excision   WISDOM TOOTH EXTRACTION      OB History     Gravida  1   Para      Term      Preterm      AB      Living         SAB      IAB      Ectopic      Multiple      Live Births               Home Medications    Prior  to Admission medications   Medication Sig Start Date End Date Taking? Authorizing Provider  benzonatate (TESSALON) 100 MG capsule Take 1 capsule (100 mg total) by mouth every 8 (eight) hours. 04/21/22  Yes Carlisle Beers, FNP  metroNIDAZOLE (FLAGYL) 500 MG tablet Take 1 tablet (500 mg total) by mouth 2 (two) times daily. 07/21/21   Merrilee Jansky, MD  promethazine-dextromethorphan (PROMETHAZINE-DM) 6.25-15 MG/5ML syrup Take 5 mLs by mouth at bedtime as needed for cough. 04/21/22  Yes Carlisle Beers, FNP  acetaminophen (TYLENOL) 500 MG tablet Take 500 mg by mouth every 6 (six) hours as needed.    [provider]   albuterol (VENTOLIN HFA) 108 (90 Base) MCG/ACT inhaler Inhale 1-2 puffs into the lungs every 6 (six) hours as needed for wheezing or shortness of breath. 06/11/21   Rhys Martini, PA-C  ALBUTEROL IN Inhale into the lungs.    [provider]  fluconazole (DIFLUCAN) 150 MG tablet Take 1 tablet (150 mg total) by mouth every 3 (three) days as needed. 01/23/22   Margaretann Loveless, PA-C  norgestimate-ethinyl estradiol (ORTHO-CYCLEN) 0.25-35 MG-MCG tablet Take 1 tablet by mouth daily. 01/21/19   [provider]  predniSONE (STERAPRED UNI-PAK 21 TAB) 10 MG (21) TBPK tablet As directed 08/25/21   Raspet, Erin K, PA-C  tiZANidine (ZANAFLEX) 4 MG tablet Take 1 tablet (4 mg total) by mouth every 8 (eight) hours as needed for muscle spasms. 08/25/21   Raspet, Noberto Retort, PA-C    Family History Family History  Problem Relation Age of Onset   Healthy Mother    Asthma Father    Diabetes Maternal Grandmother    Hypertension Maternal Grandmother    Heart disease Maternal Grandmother        hx. open heart surgery   Colon cancer Neg Hx    Esophageal cancer Neg Hx     Social History Social History   Tobacco Use   Smoking status: Never   Smokeless tobacco: Never  Vaping Use   Vaping Use: Never used  Substance Use Topics   Alcohol use: No   Drug use: No     Allergies   Patient has no known allergies.   Review of Systems Review of Systems  Respiratory:  Positive for cough.   Per HPI   Physical Exam Triage Vital Signs ED Triage Vitals  Enc Vitals Group     BP 04/21/22 1516 95/66     Pulse Rate 04/21/22 1516 (!) 114     Resp 04/21/22 1516 18     Temp 04/21/22 1516 99.6 F (37.6 C)     Temp Source 04/21/22 1516 Oral     SpO2 04/21/22 1516 97 %     Weight --      Height --      Head Circumference --      Peak Flow --      Pain Score 04/21/22 1515 6     Pain Loc --      Pain Edu? --      Excl. in GC? --    No data found.  Updated Vital Signs BP 95/66 (BP  Location: Right Arm)   Pulse (!) 114   Temp 99.6 F (37.6 C) (Oral)   Resp 18   LMP 04/07/2022   SpO2 97%   Visual Acuity Right Eye Distance:   Left Eye Distance:   Bilateral Distance:    Right Eye Near:   Left Eye Near:    Bilateral  Near:     Physical Exam Vitals and nursing note reviewed.  Constitutional:      Appearance: She is ill-appearing. She is not toxic-appearing.  HENT:     Head: Normocephalic and atraumatic.     Right Ear: Hearing, tympanic membrane, ear canal and external ear normal.     Left Ear: Hearing, tympanic membrane, ear canal and external ear normal.     Nose: Congestion present.     Mouth/Throat:     Lips: Pink.     Mouth: Mucous membranes are dry.     Pharynx: Posterior oropharyngeal erythema present.     Comments: Small amount of clear postnasal drainage visualized to the posterior oropharynx.  Eyes:     General: Lids are normal. Vision grossly intact. Gaze aligned appropriately.        Right eye: No discharge.        Left eye: No discharge.     Extraocular Movements: Extraocular movements intact.     Conjunctiva/sclera: Conjunctivae normal.  Cardiovascular:     Rate and Rhythm: Regular rhythm. Tachycardia present.     Heart sounds: Normal heart sounds, S1 normal and S2 normal.  Pulmonary:     Effort: Pulmonary effort is normal. No respiratory distress.     Breath sounds: Normal breath sounds and air entry.     Comments: No adventitious lung sounds heard to auscultation.  No respiratory distress. Musculoskeletal:     Cervical back: Neck supple.  Lymphadenopathy:     Cervical: Cervical adenopathy present.  Skin:    General: Skin is warm and dry.     Capillary Refill: Capillary refill takes less than 2 seconds.     Findings: No rash.  Neurological:     General: No focal deficit present.     Mental Status: She is alert and oriented to person, place, and time. Mental status is at baseline.     Cranial Nerves: No dysarthria or facial  asymmetry.     Motor: No weakness.     Coordination: Coordination normal.     Gait: Gait normal.     Comments: Non focal neuro exam, moves all extremities voluntarily with normal coordination.  Psychiatric:        Mood and Affect: Mood normal.        Speech: Speech normal.        Behavior: Behavior normal.        Thought Content: Thought content normal.        Judgment: Judgment normal.      UC Treatments / Results  Labs (all labs ordered are listed, but only abnormal results are displayed) Labs Reviewed  RESP PANEL BY RT-PCR (FLU A&B, COVID) ARPGX2  POC INFLUENZA A AND B ANTIGEN (URGENT CARE ONLY)    EKG   Radiology No results found.  Procedures Procedures (including critical care time)  Medications Ordered in UC Medications  sodium chloride 0.9 % bolus 1,000 mL (0 mLs Intravenous Stopped 04/21/22 1626)  ketorolac (TORADOL) 30 MG/ML injection 30 mg (30 mg Intravenous Given 04/21/22 1549)    Initial Impression / Assessment and Plan / UC Course  I have reviewed the triage vital signs and the nursing notes.  Pertinent labs & imaging results that were available during my care of the patient were reviewed by me and considered in my medical decision making (see chart for details).  Viral URI with cough Symptoms and physical exam consistent with a viral upper respiratory tract infection that will likely resolve with rest,  fluids, and prescriptions for symptomatic relief. No indication for imaging today based on stable cardiopulmonary exam and hemodynamically stable vital signs. COVID-19 and influenza PCR testing is pending. Influenza A and B point of care testing negative in clinic.  We will call patient if this is positive.  Quarantine guidelines discussed. Currently on day 4 of symptoms and does qualify for antiviral therapy.   BP at past visits usually in the 120s/80s, BP today 95/60s and soft. Heart rate elevated 115-120 and regular with auscultation. She appears dehydrated  to physical exam. 1 L normal saline bolus given for rehydration. Encouraged patient to go home and rehydrate further with pedialyte and water. Ketorolac injection  IV given for headache with some relief of symptoms. No NSAIDs for 24 hours due to ketorolac in clinic, patient voices understanding. Vitals improved after fluid bolus and patient reports she feels better after fluids.  May continue use of albuterol as needed for cough, shortness of breath, and wheeze. Tessalon pearles and promethazine DM sent to pharmacy for symptomatic relief to be taken as prescribed.  Promethazine DM cough medication may be used as needed only at bedtime due to possible drowsiness side effect (no alcohol, working, or driving while taking this advised). Guaifenesin may be purchased over the counter for nasal congestion and cough relief.   May use ibuprofen/tylenol over the counter for body aches, fever/chills, and overall discomfort associated with viral illness. Nonpharmacologic interventions for symptom relief provided and after visit summary below.   Strict ED/urgent care return precautions given.  Patient verbalizes understanding and agreement with plan.  Counseled patient regarding possible side effects and uses of all medications prescribed at today's visit.  Patient verbalizes understanding and agreement with plan.  All questions answered.  Patient discharged from urgent care in stable condition.       Final Clinical Impressions(s) / UC Diagnoses   Final diagnoses:  Influenza-like illness  Dehydration  Acute cough  Diarrhea, unspecified type     Discharge Instructions      You have a viral upper respiratory infection.  Flu in the clinic testing is negative. COVID-19 and flu testing is pending. We will call you with results if positive. If your COVID test is positive, you must stay at home until day 6 of symptoms. On day 6, you may go out into public and go back to work, but you must wear a mask  until day 11 of symptoms to prevent spread to others.  Purchase mucinex (guaifenesin)  and take this every 12 hours for the next few days to thin your nasal congestion and mucous so that you are able to get out of your body easier by coughing and blowing your nose. Drink plenty of water while taking this.  Take tessalon pearles every 8 hours as needed for cough.  Take Promethazine DM cough medication to help with your cough at nighttime so that you are able to sleep. Do not drive, drink alcohol, or go to work while taking this medication since it can make you sleepy. Only take this at nighttime.   You may take tylenol 1,000mg  and ibuprofen  every 6 hours with food as needed for fever/chills, sore throat, aches/pains, and inflammation associated with viral illness. Take this with food to avoid stomach upset.    No NSAIDs until tomorrow night due to ketorolac injection in clinic to help with headache.  Drink plenty of water to stay well hydrated. You may also drink pedialyte to help with hydration.  You may  do salt water and baking soda gargles every 4 hours as needed for your throat pain.  Please put 1 teaspoon of salt and 1/2 teaspoon of baking soda in 8 ounces of warm water then gargle and spit the water out. You may also put 1 tablespoon of honey in warm water and drink this to soothe your throat.  Place a humidifier in your room at night to help decrease dry air that can irritate your airway and cause you to have a sore throat and cough.  Please try to eat a well-balanced diet while you are sick so that your body gets proper nutrition to heal.  If you develop any new or worsening symptoms, please return.  If your symptoms are severe, please go to the emergency room.  Follow-up with your primary care provider for further evaluation and management of your symptoms as well as ongoing wellness visits.  I hope you feel better!       ED Prescriptions     Medication Sig Dispense Auth.  Provider   benzonatate (TESSALON) 100 MG capsule Take 1 capsule (100 mg total) by mouth every 8 (eight) hours. 21 capsule Carlisle BeersStanhope, Dorinda Stehr M, FNP   promethazine-dextromethorphan (PROMETHAZINE-DM) 6.25-15 MG/5ML syrup Take 5 mLs by mouth at bedtime as needed for cough. 118 mL Carlisle BeersStanhope, Nishat Livingston M, FNP      PDMP not reviewed this encounter.   Carlisle BeersStanhope, Micai Apolinar M, OregonFNP 04/21/22 1630

## 2022-04-21 NOTE — ED Triage Notes (Signed)
Pt had fever, cough, sore throat, diarrhea hoarse voice, headache/migraine for 4 days. Took tylenol for fever. Robitussin and cough drops for cough Adds that had tightness in chest so used nebulizer yesterday which helped.

## 2022-04-21 NOTE — Discharge Instructions (Addendum)
You have a viral upper respiratory infection.  Flu in the clinic testing is negative. COVID-19 and flu testing is pending. We will call you with results if positive. If your COVID test is positive, you must stay at home until day 6 of symptoms. On day 6, you may go out into public and go back to work, but you must wear a mask until day 11 of symptoms to prevent spread to others.  Purchase mucinex (guaifenesin) 1200mg  and take this every 12 hours for the next few days to thin your nasal congestion and mucous so that you are able to get out of your body easier by coughing and blowing your nose. Drink plenty of water while taking this.  Take tessalon pearles every 8 hours as needed for cough.  Take Promethazine DM cough medication to help with your cough at nighttime so that you are able to sleep. Do not drive, drink alcohol, or go to work while taking this medication since it can make you sleepy. Only take this at nighttime.   You may take tylenol 1,000mg  and ibuprofen 600mg  every 6 hours with food as needed for fever/chills, sore throat, aches/pains, and inflammation associated with viral illness. Take this with food to avoid stomach upset.    No NSAIDs until tomorrow night due to ketorolac injection in clinic to help with headache.  Drink plenty of water to stay well hydrated. You may also drink pedialyte to help with hydration.  You may do salt water and baking soda gargles every 4 hours as needed for your throat pain.  Please put 1 teaspoon of salt and 1/2 teaspoon of baking soda in 8 ounces of warm water then gargle and spit the water out. You may also put 1 tablespoon of honey in warm water and drink this to soothe your throat.  Place a humidifier in your room at night to help decrease dry air that can irritate your airway and cause you to have a sore throat and cough.  Please try to eat a well-balanced diet while you are sick so that your body gets proper nutrition to heal.  If you develop  any new or worsening symptoms, please return.  If your symptoms are severe, please go to the emergency room.  Follow-up with your primary care provider for further evaluation and management of your symptoms as well as ongoing wellness visits.  I hope you feel better!

## 2022-05-07 ENCOUNTER — Telehealth: Payer: Commercial Managed Care - HMO | Admitting: Nurse Practitioner

## 2022-05-07 DIAGNOSIS — B9689 Other specified bacterial agents as the cause of diseases classified elsewhere: Secondary | ICD-10-CM | POA: Diagnosis not present

## 2022-05-07 DIAGNOSIS — J028 Acute pharyngitis due to other specified organisms: Secondary | ICD-10-CM | POA: Diagnosis not present

## 2022-05-07 MED ORDER — AMOXICILLIN 500 MG PO CAPS: 500.0000 mg | ORAL_CAPSULE | Freq: Two times a day (BID) | ORAL | 0 refills | Status: AC

## 2022-05-07 NOTE — Patient Instructions (Signed)
Jeri Modena, thank you for joining Claiborne Rigg, NP for today's virtual visit.  While this provider is not your primary care provider (PCP), if your PCP is located in our provider database this encounter information will be shared with them immediately following your visit.   A [facility] MyChart account gives you access to today's visit and all your visits, tests, and labs performed at [facility] " click here if you don't have a [facility] MyChart account or go to [email or web address]  Consent: (Patient) Mahari Vankirk provided verbal consent for this virtual visit at the beginning of the encounter.  Current Medications:  Current Outpatient Medications:    amoxicillin (AMOXIL) 500 MG capsule, Take 1 capsule (500 mg total) by mouth 2 (two) times daily for 10 days., Disp: 20 capsule, Rfl: 0   acetaminophen (TYLENOL) 500 MG tablet, Take 500 mg by mouth every 6 (six) hours as needed., Disp: , Rfl:    albuterol (VENTOLIN HFA) 108 (90 Base) MCG/ACT inhaler, Inhale 1-2 puffs into the lungs every 6 (six) hours as needed for wheezing or shortness of breath., Disp: 1 each, Rfl: 0   ALBUTEROL IN, Inhale into the lungs., Disp: , Rfl:    benzonatate (TESSALON) 100 MG capsule, Take 1 capsule (100 mg total) by mouth every 8 (eight) hours., Disp: 21 capsule, Rfl: 0   fluconazole (DIFLUCAN) 150 MG tablet, Take 1 tablet (150 mg total) by mouth every 3 (three) days as needed., Disp: 2 tablet, Rfl: 0   norgestimate-ethinyl estradiol (ORTHO-CYCLEN) 0.25-35 MG-MCG tablet, Take 1 tablet by mouth daily., Disp: , Rfl:    predniSONE (STERAPRED UNI-PAK 21 TAB) 10 MG (21) TBPK tablet, As directed, Disp: 21 tablet, Rfl: 0   promethazine-dextromethorphan (PROMETHAZINE-DM) 6.25-15 MG/5ML syrup, Take 5 mLs by mouth at bedtime as needed for cough., Disp: 118 mL, Rfl: 0   tiZANidine (ZANAFLEX) 4 MG tablet, Take 1 tablet (4 mg total) by mouth every 8 (eight) hours as needed for muscle spasms., Disp: 30  tablet, Rfl: 0   Medications ordered in this encounter:  Meds ordered this encounter  Medications   amoxicillin (AMOXIL) 500 MG capsule    Sig: Take 1 capsule (500 mg total) by mouth 2 (two) times daily for 10 days.    Dispense:  20 capsule    Refill:  0    Order Specific Question:   Supervising Provider    Answer:   Merrilee Jansky [id number]     *If you need refills on other medications prior to your next appointment, please contact your pharmacy*  Follow-Up: Call back or seek an in-person evaluation if the symptoms worsen or if the condition fails to improve as anticipated.  [facility] [phone]  Other Instructions INSTRUCTIONS: use a humidifier for nasal congestion Drink plenty of fluids, rest and wash hands frequently to avoid the spread of infection Alternate tylenol and Motrin for relief of fever    If you have been instructed to have an in-person evaluation today at a local Urgent Care facility, please use the link below. It will take you to a list of all of our available [facility], including address, phone number and hours of operation. Please do not delay care.  [facility]  If you or a family member do not have a primary care provider, use the link below to schedule a visit and establish care. When you choose a  primary care physician or advanced practice provider, you gain  a long-term partner in health. Find a Primary Care Provider  Learn more about [facility]'s in-office and virtual care options: [facility] - Get Care Now  

## 2022-05-07 NOTE — Progress Notes (Signed)
Virtual Visit Consent   Suzanne Wolfe, you are scheduled for a virtual visit with a [facility] provider today. Just as with appointments in the office, your consent must be obtained to participate. Your consent will be active for this visit and any virtual visit you may have with one of our providers in the next 365 days. If you have a MyChart account, a copy of this consent can be sent to you electronically.  As this is a virtual visit, video technology does not allow for your provider to perform a traditional examination. This may limit your provider's ability to fully assess your condition. If your provider identifies any concerns that need to be evaluated in person or the need to arrange testing (such as labs, EKG, etc.), we will make arrangements to do so. Although advances in technology are sophisticated, we cannot ensure that it will always work on either your end or our end. If the connection with a video visit is poor, the visit may have to be switched to a telephone visit. With either a video or telephone visit, we are not always able to ensure that we have a secure connection.  By engaging in this virtual visit, you consent to the provision of healthcare and authorize for your insurance to be billed (if applicable) for the services provided during this visit. Depending on your insurance coverage, you may receive a charge related to this service.  I need to obtain your verbal consent now. Are you willing to proceed with your visit today? [name] has provided verbal consent on 05/07/2022 for a virtual visit (video or telephone). Gildardo Pounds, NP  Date: 05/07/2022 7:33 PM  Virtual Visit via Video Note   I, Gildardo Pounds, connected with  Catrin Headden  ([id number], Nov 19, 1995) on 05/07/22 at  7:30 PM EST by a video-enabled telemedicine application and verified that I am speaking with the correct person using two identifiers.  Location: Patient: Virtual Visit Location Patient:  Home Provider: Virtual Visit Location Provider: Home Office   I discussed the limitations of evaluation and management by telemedicine and the availability of in person appointments. The patient expressed understanding and agreed to proceed.    History of Present Illness: Suzanne Wolfe is a 26 y.o. who identifies as a female who was assigned female at birth, and is being seen today for bacterial pharyngitis.  Sore Throat: Patient complains of sore throat. Associated symptoms include dry cough, enlarged tonsils, headache, hoarseness, sinus and nasal congestion, sore throat,  erythematous swollen glands, and white spots in throat.Onset of symptoms was a few weeks ago, unchanged since that time. She has not had recent close exposure to someone with proven streptococcal pharyngitis. Treatments tried at home: salt water gargles ineffective.    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   Moderate persistent asthma without complication 0000000   Patellar instability of right knee 10/31/2016   Other constipation 06/28/2015   Rectal bleeding 06/28/2015   Infected pilonidal cyst 10/22/2014   Pilonidal sinus 07/02/2014   Episodic tension-type headache, not intractable 12/04/2013   Migraine without aura and without status migrainosus, not intractable 12/04/2013    Allergies: No Known Allergies Medications:  Current Outpatient Medications:    amoxicillin (AMOXIL) 500 MG capsule, Take 1 capsule (500 mg total) by mouth 2 (two) times daily for 10 days., Disp: 20 capsule, Rfl: 0   acetaminophen (TYLENOL) 500 MG tablet, Take 500 mg by mouth every 6 (  six) hours as needed., Disp: , Rfl:    albuterol (VENTOLIN HFA) 108 (90 Base) MCG/ACT inhaler, Inhale 1-2 puffs into the lungs every 6 (six) hours as needed for wheezing or shortness of breath., Disp: 1 each, Rfl: 0   ALBUTEROL IN, Inhale into the lungs., Disp: , Rfl:    benzonatate (TESSALON) 100 MG capsule,  Take 1 capsule (100 mg total) by mouth every 8 (eight) hours., Disp: 21 capsule, Rfl: 0   fluconazole (DIFLUCAN) 150 MG tablet, Take 1 tablet (150 mg total) by mouth every 3 (three) days as needed., Disp: 2 tablet, Rfl: 0   norgestimate-ethinyl estradiol (ORTHO-CYCLEN) 0.25-35 MG-MCG tablet, Take 1 tablet by mouth daily., Disp: , Rfl:    predniSONE (STERAPRED UNI-PAK 21 TAB) 10 MG (21) TBPK tablet, As directed, Disp: 21 tablet, Rfl: 0   promethazine-dextromethorphan (PROMETHAZINE-DM) 6.25-15 MG/5ML syrup, Take 5 mLs by mouth at bedtime as needed for cough., Disp: 118 mL, Rfl: 0   tiZANidine (ZANAFLEX) 4 MG tablet, Take 1 tablet (4 mg total) by mouth every 8 (eight) hours as needed for muscle spasms., Disp: 30 tablet, Rfl: 0  Observations/Objective: Patient is well-developed, well-nourished in no acute distress.  Resting comfortably at home.  Head is normocephalic, atraumatic.  No labored breathing.  Speech is clear and coherent with logical content.  Patient is alert and oriented at baseline.    Assessment and Plan: 1. Acute bacterial pharyngitis - amoxicillin (AMOXIL) 500 MG capsule; Take 1 capsule (500 mg total) by mouth 2 (two) times daily for 10 days.  Dispense: 20 capsule; Refill: 0 INSTRUCTIONS: use a humidifier for nasal congestion Drink plenty of fluids, rest and wash hands frequently to avoid the spread of infection Alternate tylenol and Motrin for relief of fever   Follow Up Instructions: I discussed the assessment and treatment plan with the patient. The patient was provided an opportunity to ask questions and all were answered. The patient agreed with the plan and demonstrated an understanding of the instructions.  A copy of instructions were sent to the patient via MyChart unless otherwise noted below.    The patient was advised to call back or seek an in-person evaluation if the symptoms worsen or if the condition fails to improve as anticipated.  Time:  I spent 12 minutes  with the patient via telehealth technology discussing the above problems/concerns.    Claiborne Rigg, NP

## 2022-06-04 ENCOUNTER — Ambulatory Visit (HOSPITAL_COMMUNITY)
Admission: EM | Admit: 2022-06-04 | Discharge: 2022-06-04 | Disposition: A | Discharge status: Home / Self Care | Payer: 59 | Attending: Family Medicine | Admitting: Family Medicine

## 2022-06-04 ENCOUNTER — Encounter (HOSPITAL_COMMUNITY): Payer: Self-pay

## 2022-06-04 DIAGNOSIS — N9089 Other specified noninflammatory disorders of vulva and perineum: Secondary | ICD-10-CM

## 2022-06-04 DIAGNOSIS — A6000 Herpesviral infection of urogenital system, unspecified: Secondary | ICD-10-CM

## 2022-06-04 DIAGNOSIS — A6004 Herpesviral vulvovaginitis: Secondary | ICD-10-CM

## 2022-06-04 LAB — HIV ANTIBODY (ROUTINE TESTING W REFLEX): HIV Screen 4th Generation wRfx: NONREACTIVE

## 2022-06-04 MED ORDER — VALACYCLOVIR HCL 1 G PO TABS: 1000.0000 mg | ORAL_TABLET | Freq: Two times a day (BID) | ORAL | 0 refills | Status: AC

## 2022-06-04 NOTE — Discharge Instructions (Addendum)
You were seen today for lesion at the labia, concerning for genital herpes.  I have swabbed the lesion, as well as the vagina, and these should be resulted tomorrow.  Your lab work will also be resulted by tomorrow.  These results will be on mychart for you to see.  If anything is abnormal or needs to be treated otherwise we will let you know.  I have sent out valtrex to your pharmacy today.  Please start this ASAP.  I have also given you information on this as well.

## 2022-06-04 NOTE — ED Provider Notes (Signed)
[facility]    CSN: [id number] Arrival date & time: 06/04/22  1756      History   Chief Complaint Chief Complaint  Patient presents with   SEXUALLY TRANSMITTED DISEASE    I am having vaginal discharge and needed std testing - Entered by patient    HPI Suzanne Wolfe is a 27 y.o. female.   She has noted a sore/ingrown hair at the right labia.  Not sure if something to be worried about as she did have unprotected sex 1 week ago.  She did have vaginal d/c, looked like yeast infection, but it did resolve.  No itching.  LMP was 05/26/22.   She did start bleeding/spotting this morning.  She did miss an ocp yesterday.  She is agreeable to blood work today as well.        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   Moderate persistent asthma without complication 84/13/2440   Patellar instability of right knee 10/31/2016   Other constipation 06/28/2015   Rectal bleeding 06/28/2015   Infected pilonidal cyst 10/22/2014   Pilonidal sinus 07/02/2014   Episodic tension-type headache, not intractable 12/04/2013   Migraine without aura and without status migrainosus, not intractable 12/04/2013    Past Surgical History:  Procedure Laterality Date   COLONOSCOPY  08-04-2016  dr stark   ESOPHAGOGASTRODUODENOSCOPY  06/20/2016   LAPAROSCOPIC APPENDECTOMY  07/16/2009   MEDIAL PATELLOFEMORAL LIGAMENT REPAIR Right 10/31/2016   Procedure: Right knee arthroscopic assisted medial patella femoral ligament reconstruction;  Surgeon: Nicholes Stairs, MD;  Location: [facility];  Service: Orthopedics;  Laterality: Right;   PILONIDAL CYST EXCISION N/A 07/02/2014   Procedure: EXCISION OF PILONIDAL CYST WITH PRIMARY CLOSURE;  Surgeon: Jerilynn Mages. Gerald Stabs, MD;   Location: [facility];  Service: Pediatrics;  Laterality: N/A;   PILONIDAL CYST EXCISION N/A 10/22/2014   Procedure: EXCISION PILONIDAL CYST AND SINUS;  Surgeon: Gerald Stabs, MD;  Location: [place] [facility];  Service: Pediatrics;  Laterality: N/A;  Re-excision   WISDOM TOOTH EXTRACTION      OB History     Gravida  1   Para      Term      Preterm      AB      Living         SAB      IAB      Ectopic      Multiple      Live Births               Home Medications    Prior to Admission medications   Medication Sig Start Date End Date Taking? Authorizing Provider  acetaminophen (TYLENOL) 500 MG tablet Take 500 mg by mouth every 6 (six) hours as needed.    [provider]  albuterol (VENTOLIN HFA) 108 (90 Base) MCG/ACT inhaler Inhale 1-2 puffs into the lungs every 6 (six) hours as needed for wheezing or shortness of breath. 06/11/21   Hazel Sams, PA-C  ALBUTEROL IN Inhale into the lungs.    [provider]  benzonatate (TESSALON) 100 MG capsule Take 1 capsule (100 mg total) by mouth every 8 (eight) hours. 04/21/22   Talbot Grumbling, FNP  fluconazole (  DIFLUCAN) 150 MG tablet Take 1 tablet (150 mg total) by mouth every 3 (three) days as needed. 01/23/22   Margaretann Loveless, PA-C  norgestimate-ethinyl estradiol (ORTHO-CYCLEN) 0.25-35 MG-MCG tablet Take 1 tablet by mouth daily. 01/21/19   [provider]  predniSONE (STERAPRED UNI-PAK 21 TAB) 10 MG (21) TBPK tablet As directed 08/25/21   Raspet, Tesneem Dufrane K, PA-C  promethazine-dextromethorphan (PROMETHAZINE-DM) 6.25-15 MG/5ML syrup Take 5 mLs by mouth at bedtime as needed for cough. 04/21/22   Carlisle Beers, FNP  tiZANidine (ZANAFLEX) 4 MG tablet Take 1 tablet (4 mg total) by mouth every 8 (eight) hours as needed for muscle spasms. 08/25/21   Raspet, Noberto Retort, PA-C    Family History Family History  Problem Relation Age of Onset   Healthy Mother    Asthma Father     Diabetes Maternal Grandmother    Hypertension Maternal Grandmother    Heart disease Maternal Grandmother        hx. open heart surgery   Colon cancer Neg Hx    Esophageal cancer Neg Hx     Social History Social History   Tobacco Use   Smoking status: Never   Smokeless tobacco: Never  Vaping Use   Vaping Use: Never used  Substance Use Topics   Alcohol use: No   Drug use: No     Allergies   Patient has no known allergies.   Review of Systems Review of Systems  Constitutional: Negative.   HENT: Negative.    Respiratory: Negative.    Cardiovascular: Negative.   Gastrointestinal: Negative.   Genitourinary:  Positive for vaginal discharge.  Musculoskeletal: Negative.      Physical Exam Triage Vital Signs ED Triage Vitals  Enc Vitals Group     BP 06/04/22 1817 124/86     Pulse Rate 06/04/22 1817 (!) 110     Resp 06/04/22 1817 18     Temp 06/04/22 1817 98.3 F (36.8 C)     Temp Source 06/04/22 1817 Oral     SpO2 06/04/22 1817 98 %     Weight --      Height --      Head Circumference --      Peak Flow --      Pain Score 06/04/22 1820 4     Pain Loc --      Pain Edu? --      Excl. in GC? --    No data found.  Updated Vital Signs BP 124/86 (BP Location: Left Arm)   Pulse (!) 110   Temp 98.3 F (36.8 C) (Oral)   Resp 18   LMP 05/23/2022 Comment: Patient states she is having an irregular period  SpO2 98%   Visual Acuity Right Eye Distance:   Left Eye Distance:   Bilateral Distance:    Right Eye Near:   Left Eye Near:    Bilateral Near:     Physical Exam Constitutional:      Appearance: Normal appearance.  Genitourinary:    Comments: At the right labia is what appears to be an ulceration.  There is minimal tenderness.  This was swabbed today.  Skin:    General: Skin is warm.  Neurological:     Mental Status: She is alert. Mental status is at baseline.  Psychiatric:        Mood and Affect: Mood normal.      UC Treatments / Results   Labs (all labs ordered are listed, but only abnormal results are  displayed) Labs Reviewed  HSV CULTURE AND TYPING  HIV ANTIBODY (ROUTINE TESTING W REFLEX)  RPR  CERVICOVAGINAL ANCILLARY ONLY    EKG   Radiology No results found.  Procedures Procedures (including critical care time)  Medications Ordered in UC Medications - No data to display  Initial Impression / Assessment and Plan / UC Course  I have reviewed the triage vital signs and the nursing notes.  Pertinent labs & imaging results that were available during my care of the patient were reviewed by me and considered in my medical decision making (see chart for details).   Final Clinical Impressions(s) / UC Diagnoses   Final diagnoses:  Labial lesion  Genital herpes simplex, unspecified site     Discharge Instructions      You were seen today for lesion at the labia, concerning for genital herpes.  I have swabbed the lesion, as well as the vagina, and these should be resulted tomorrow.  Your lab work will also be resulted by tomorrow.  These results will be on mychart for you to see.  If anything is abnormal or needs to be treated otherwise we will let you know.  I have sent out valtrex to your pharmacy today.  Please start this ASAP.  I have also given you information on this as well.    ED Prescriptions     Medication Sig Dispense Auth. Provider   valACYclovir (VALTREX) 1000 MG tablet Take 1 tablet (1,000 mg total) by mouth 2 (two) times daily for 7 days. 14 tablet Rondel Oh, MD      PDMP not reviewed this encounter.   Rondel Oh, MD 06/04/22 [phone]

## 2022-06-04 NOTE — ED Triage Notes (Signed)
Patient states she was having vaginal discharge x 2 days, but states she is not currently having. Patient also states she has an ingrown hair on the right labia x 2 days ago.  Patient does state that she had unprotected sex.

## 2022-06-05 ENCOUNTER — Inpatient Hospital Stay (HOSPITAL_COMMUNITY)
Admission: RE | Admit: 2022-06-05 | Discharge: 2022-06-05 | Disposition: A | Discharge status: Home / Self Care | Payer: 59 | Source: Ambulatory Visit | Attending: Family Medicine | Admitting: Family Medicine

## 2022-06-05 LAB — CERVICOVAGINAL ANCILLARY ONLY
Bacterial Vaginitis (gardnerella): POSITIVE — AB
Candida Glabrata: NEGATIVE
Candida Vaginitis: NEGATIVE
Chlamydia: NEGATIVE
Comment: NEGATIVE
Comment: NEGATIVE
Comment: NEGATIVE
Comment: NEGATIVE
Comment: NEGATIVE
Comment: NORMAL
Neisseria Gonorrhea: NEGATIVE
Trichomonas: NEGATIVE

## 2022-06-05 LAB — RPR: RPR Ser Ql: NONREACTIVE

## 2022-06-06 ENCOUNTER — Telehealth (HOSPITAL_COMMUNITY): Payer: Self-pay | Admitting: Emergency Medicine

## 2022-06-06 MED ORDER — METRONIDAZOLE 500 MG PO TABS: 500.0000 mg | ORAL_TABLET | Freq: Two times a day (BID) | ORAL | 0 refills | Status: DC

## 2022-06-07 ENCOUNTER — Telehealth (HOSPITAL_COMMUNITY): Payer: Self-pay | Admitting: Emergency Medicine

## 2022-06-07 LAB — HSV CULTURE AND TYPING

## 2022-06-07 MED ORDER — METRONIDAZOLE 500 MG PO TABS: 500.0000 mg | ORAL_TABLET | Freq: Two times a day (BID) | ORAL | 0 refills | Status: DC

## 2022-06-07 NOTE — Telephone Encounter (Signed)
Patient wanted prescription sent to a different pharmacy 

## 2022-06-08 ENCOUNTER — Telehealth (HOSPITAL_COMMUNITY): Payer: Self-pay | Admitting: Internal Medicine

## 2022-06-08 MED ORDER — LIDOCAINE HCL URETHRAL/MUCOSAL 2 % EX PRSY: 1.0000 | PREFILLED_SYRINGE | CUTANEOUS | 0 refills | Status: DC | PRN

## 2022-06-08 NOTE — Telephone Encounter (Signed)
Lidocaine sent to pharmacy on file

## 2022-07-19 ENCOUNTER — Telehealth: Payer: 59 | Admitting: Physician Assistant

## 2022-07-19 DIAGNOSIS — R3989 Other symptoms and signs involving the genitourinary system: Secondary | ICD-10-CM

## 2022-07-19 DIAGNOSIS — B3731 Acute candidiasis of vulva and vagina: Secondary | ICD-10-CM | POA: Diagnosis not present

## 2022-07-19 MED ORDER — NITROFURANTOIN MONOHYD MACRO 100 MG PO CAPS: 100.0000 mg | ORAL_CAPSULE | Freq: Two times a day (BID) | ORAL | 0 refills | Status: DC

## 2022-07-19 MED ORDER — FLUCONAZOLE 150 MG PO TABS: 150.0000 mg | ORAL_TABLET | ORAL | 0 refills | Status: DC | PRN

## 2022-07-19 NOTE — Progress Notes (Signed)
Virtual Visit Consent   Suzanne Wolfe, you are scheduled for a virtual visit with a [facility] provider today. Just as with appointments in the office, your consent must be obtained to participate. Your consent will be active for this visit and any virtual visit you may have with one of our providers in the next 365 days. If you have a MyChart account, a copy of this consent can be sent to you electronically.  As this is a virtual visit, video technology does not allow for your provider to perform a traditional examination. This may limit your provider's ability to fully assess your condition. If your provider identifies any concerns that need to be evaluated in person or the need to arrange testing (such as labs, EKG, etc.), we will make arrangements to do so. Although advances in technology are sophisticated, we cannot ensure that it will always work on either your end or our end. If the connection with a video visit is poor, the visit may have to be switched to a telephone visit. With either a video or telephone visit, we are not always able to ensure that we have a secure connection.  By engaging in this virtual visit, you consent to the provision of healthcare and authorize for your insurance to be billed (if applicable) for the services provided during this visit. Depending on your insurance coverage, you may receive a charge related to this service.  I need to obtain your verbal consent now. Are you willing to proceed with your visit today? Tyshika Guidry has provided verbal consent on 07/19/2022 for a virtual visit (video or telephone). Mar Daring, PA-C  Date: 07/19/2022 7:50 AM  Virtual Visit via Video Note   I, Mar Daring, connected with  Ionna Sessom  ([id number], 01/23/1996) on 07/19/22 at  7:45 AM EST by a video-enabled telemedicine application and verified that I am speaking with the correct person using two identifiers.  Location: Patient: Virtual Visit Location  Patient: Home Provider: Virtual Visit Location Provider: Home Office   I discussed the limitations of evaluation and management by telemedicine and the availability of in person appointments. The patient expressed understanding and agreed to proceed.    History of Present Illness: Suzanne Wolfe is a 27 y.o. who identifies as a female who was assigned female at birth, and is being seen today for possible UTI and yeast infection.  HPI: Urinary Tract Infection  This is a new problem. The current episode started in the past 7 days (4 days). The problem occurs every urination. The problem has been gradually worsening. The quality of the pain is described as burning. The pain is mild. There has been no fever. Associated symptoms include a discharge (thick and white discharge with itching; used boric acid suppository with some relief), frequency, hesitancy and urgency. Pertinent negatives include no chills, flank pain, hematuria, nausea or vomiting. She has tried increased fluids (AZO) for the symptoms. The treatment provided no relief.     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   Moderate persistent asthma without complication 0000000   Patellar instability of right knee 10/31/2016   Other constipation 06/28/2015   Rectal bleeding 06/28/2015   Infected pilonidal cyst 10/22/2014   Pilonidal sinus 07/02/2014   Episodic tension-type headache, not intractable 12/04/2013   Migraine without aura and without status migrainosus, not intractable 12/04/2013    Allergies: No Known Allergies Medications:  Current Outpatient Medications:    fluconazole (  DIFLUCAN) 150 MG tablet, Take 1 tablet (150 mg total) by mouth every 3 (three) days as needed., Disp: 2 tablet, Rfl: 0   nitrofurantoin, macrocrystal-monohydrate, (MACROBID) 100 MG capsule, Take 1 capsule (100 mg total) by mouth 2 (two) times daily., Disp: 10 capsule, Rfl: 0   acetaminophen (TYLENOL) 500  MG tablet, Take 500 mg by mouth every 6 (six) hours as needed., Disp: , Rfl:    albuterol (VENTOLIN HFA) 108 (90 Base) MCG/ACT inhaler, Inhale 1-2 puffs into the lungs every 6 (six) hours as needed for wheezing or shortness of breath., Disp: 1 each, Rfl: 0   ALBUTEROL IN, Inhale into the lungs., Disp: , Rfl:    lidocaine (XYLOCAINE) 2 % jelly, Apply 1 Application topically as needed., Disp: 20 mL, Rfl: 0   metroNIDAZOLE (FLAGYL) 500 MG tablet, Take 1 tablet (500 mg total) by mouth 2 (two) times daily., Disp: 14 tablet, Rfl: 0   norgestimate-ethinyl estradiol (ORTHO-CYCLEN) 0.25-35 MG-MCG tablet, Take 1 tablet by mouth daily., Disp: , Rfl:   Observations/Objective: Patient is well-developed, well-nourished in no acute distress.  Resting comfortably at home.  Head is normocephalic, atraumatic.  No labored breathing.  Speech is clear and coherent with logical content.  Patient is alert and oriented at baseline.    Assessment and Plan: 1. Suspected UTI - nitrofurantoin, macrocrystal-monohydrate, (MACROBID) 100 MG capsule; Take 1 capsule (100 mg total) by mouth 2 (two) times daily.  Dispense: 10 capsule; Refill: 0  2. Yeast vaginitis - fluconazole (DIFLUCAN) 150 MG tablet; Take 1 tablet (150 mg total) by mouth every 3 (three) days as needed.  Dispense: 2 tablet; Refill: 0  - Worsening symptoms.  - Will treat empirically with Macrobid - May use AZO for bladder spasms - Fluconazole for yeast symptoms - Continue to push fluids.  - Seek in person evaluation for urine culture if symptoms do not improve or if they worsen.    Follow Up Instructions: I discussed the assessment and treatment plan with the patient. The patient was provided an opportunity to ask questions and all were answered. The patient agreed with the plan and demonstrated an understanding of the instructions.  A copy of instructions were sent to the patient via MyChart unless otherwise noted below.    The patient was advised  to call back or seek an in-person evaluation if the symptoms worsen or if the condition fails to improve as anticipated.  Time:  I spent 8 minutes with the patient via telehealth technology discussing the above problems/concerns.    Mar Daring, PA-C

## 2022-07-19 NOTE — Patient Instructions (Signed)
Lennox Grumbles, thank you for joining Mar Daring, PA-C for today's virtual visit.  While this provider is not your primary care provider (PCP), if your PCP is located in our provider database this encounter information will be shared with them immediately following your visit.   [facility] account gives you access to today's visit and all your visits, tests, and labs performed at [facility] " click here if you don't have a [facility] account or go to [email or web address]  Consent: (Patient) Suzanne Wolfe provided verbal consent for this virtual visit at the beginning of the encounter.  Current Medications:  Current Outpatient Medications:    fluconazole (DIFLUCAN) 150 MG tablet, Take 1 tablet (150 mg total) by mouth every 3 (three) days as needed., Disp: 2 tablet, Rfl: 0   nitrofurantoin, macrocrystal-monohydrate, (MACROBID) 100 MG capsule, Take 1 capsule (100 mg total) by mouth 2 (two) times daily., Disp: 10 capsule, Rfl: 0   acetaminophen (TYLENOL) 500 MG tablet, Take 500 mg by mouth every 6 (six) hours as needed., Disp: , Rfl:    albuterol (VENTOLIN HFA) 108 (90 Base) MCG/ACT inhaler, Inhale 1-2 puffs into the lungs every 6 (six) hours as needed for wheezing or shortness of breath., Disp: 1 each, Rfl: 0   ALBUTEROL IN, Inhale into the lungs., Disp: , Rfl:    lidocaine (XYLOCAINE) 2 % jelly, Apply 1 Application topically as needed., Disp: 20 mL, Rfl: 0   metroNIDAZOLE (FLAGYL) 500 MG tablet, Take 1 tablet (500 mg total) by mouth 2 (two) times daily., Disp: 14 tablet, Rfl: 0   norgestimate-ethinyl estradiol (ORTHO-CYCLEN) 0.25-35 MG-MCG tablet, Take 1 tablet by mouth daily., Disp: , Rfl:    Medications ordered in this encounter:  Meds ordered this encounter  Medications   nitrofurantoin, macrocrystal-monohydrate, (MACROBID) 100 MG capsule    Sig: Take 1 capsule (100 mg total) by mouth 2 (two) times daily.    Dispense:  10 capsule    Refill:   0    Order Specific Question:   Supervising Provider    Answer:   Chase Picket [id number]   fluconazole (DIFLUCAN) 150 MG tablet    Sig: Take 1 tablet (150 mg total) by mouth every 3 (three) days as needed.    Dispense:  2 tablet    Refill:  0    Order Specific Question:   Supervising Provider    Answer:   Chase Picket [id number]     *If you need refills on other medications prior to your next appointment, please contact your pharmacy*  Follow-Up: Call back or seek an in-person evaluation if the symptoms worsen or if the condition fails to improve as anticipated.  [place] [phone]  Other Instructions  Urinary Tract Infection, Adult  A urinary tract infection (UTI) is an infection of any part of the urinary tract. The urinary tract includes the kidneys, ureters, bladder, and urethra. These organs make, store, and get rid of urine in the body. An upper UTI affects the ureters and kidneys. A lower UTI affects the bladder and urethra. What are the causes? Most urinary tract infections are caused by bacteria in your genital area around your urethra, where urine leaves your body. These bacteria grow and cause inflammation of your urinary tract. What increases the risk? You are more likely to develop this condition if: You have a urinary catheter that stays in place. You are not able to control when you urinate or have a bowel  movement (incontinence). You are female and you: Use a spermicide or diaphragm for birth control. Have low estrogen levels. Are pregnant. You have certain genes that increase your risk. You are sexually active. You take antibiotic medicines. You have a condition that causes your flow of urine to slow down, such as: An enlarged prostate, if you are female. Blockage in your urethra. A kidney stone. A nerve condition that affects your bladder control (neurogenic bladder). Not getting enough to drink, or not urinating often. You have  certain medical conditions, such as: Diabetes. A weak disease-fighting system (immunesystem). Sickle cell disease. Gout. Spinal cord injury. What are the signs or symptoms? Symptoms of this condition include: Needing to urinate right away (urgency). Frequent urination. This may include small amounts of urine each time you urinate. Pain or burning with urination. Blood in the urine. Urine that smells bad or unusual. Trouble urinating. Cloudy urine. Vaginal discharge, if you are female. Pain in the abdomen or the lower back. You may also have: Vomiting or a decreased appetite. Confusion. Irritability or tiredness. A fever or chills. Diarrhea. The first symptom in older adults may be confusion. In some cases, they may not have any symptoms until the infection has worsened. How is this diagnosed? This condition is diagnosed based on your medical history and a physical exam. You may also have other tests, including: Urine tests. Blood tests. Tests for STIs (sexually transmitted infections). If you have had more than one UTI, a cystoscopy or imaging studies may be done to determine the cause of the infections. How is this treated? Treatment for this condition includes: Antibiotic medicine. Over-the-counter medicines to treat discomfort. Drinking enough water to stay hydrated. If you have frequent infections or have other conditions such as a kidney stone, you may need to see a health care provider who specializes in the urinary tract (urologist). In rare cases, urinary tract infections can cause sepsis. Sepsis is a life-threatening condition that occurs when the body responds to an infection. Sepsis is treated in the hospital with IV antibiotics, fluids, and other medicines. Follow these instructions at home:  Medicines Take over-the-counter and prescription medicines only as told by your health care provider. If you were prescribed an antibiotic medicine, take it as told by your  health care provider. Do not stop using the antibiotic even if you start to feel better. General instructions Make sure you: Empty your bladder often and completely. Do not hold urine for long periods of time. Empty your bladder after sex. Wipe from front to back after urinating or having a bowel movement if you are female. Use each tissue only one time when you wipe. Drink enough fluid to keep your urine pale yellow. Keep all follow-up visits. This is important. Contact a health care provider if: Your symptoms do not get better after 1-2 days. Your symptoms go away and then return. Get help right away if: You have severe pain in your back or your lower abdomen. You have a fever or chills. You have nausea or vomiting. Summary A urinary tract infection (UTI) is an infection of any part of the urinary tract, which includes the kidneys, ureters, bladder, and urethra. Most urinary tract infections are caused by bacteria in your genital area. Treatment for this condition often includes antibiotic medicines. If you were prescribed an antibiotic medicine, take it as told by your health care provider. Do not stop using the antibiotic even if you start to feel better. Keep all follow-up visits. This is important.  This information is not intended to replace advice given to you by your health care provider. Make sure you discuss any questions you have with your health care provider. Document Revised: 12/12/2019 Document Reviewed: 12/12/2019 Elsevier Patient Education  [place].    If you have been instructed to have an in-person evaluation today at a local Urgent Care facility, please use the link below. It will take you to a list of all of our available [facility], including address, phone number and hours of operation. Please do not delay care.  [facility]  If you or a family member do not have a primary care provider, use the link below to schedule a visit  and establish care. When you choose a [facility] primary care physician or advanced practice provider, you gain a long-term partner in health. Find a Primary Care Provider  Learn more about [facility]'s in-office and virtual care options: [facility]

## 2022-08-06 ENCOUNTER — Encounter: Payer: Self-pay | Admitting: Family

## 2022-08-06 ENCOUNTER — Telehealth: Payer: 59 | Admitting: Family

## 2022-08-06 DIAGNOSIS — A6004 Herpesviral vulvovaginitis: Secondary | ICD-10-CM

## 2022-08-06 MED ORDER — VALACYCLOVIR HCL 1 G PO TABS: 1000.0000 mg | ORAL_TABLET | Freq: Two times a day (BID) | ORAL | 0 refills | Status: AC

## 2022-08-06 MED ORDER — LIDOCAINE 5 % EX OINT: 1.0000 | TOPICAL_OINTMENT | CUTANEOUS | 0 refills | Status: DC | PRN

## 2022-08-06 NOTE — Progress Notes (Signed)
Virtual Visit Consent   [name], you are scheduled for a virtual visit with a [facility] provider today. Just as with appointments in the office, your consent must be obtained to participate. Your consent will be active for this visit and any virtual visit you may have with one of our providers in the next 365 days. If you have a MyChart account, a copy of this consent can be sent to you electronically.  As this is a virtual visit, video technology does not allow for your provider to perform a traditional examination. This may limit your provider's ability to fully assess your condition. If your provider identifies any concerns that need to be evaluated in person or the need to arrange testing (such as labs, EKG, etc.), we will make arrangements to do so. Although advances in technology are sophisticated, we cannot ensure that it will always work on either your end or our end. If the connection with a video visit is poor, the visit may have to be switched to a telephone visit. With either a video or telephone visit, we are not always able to ensure that we have a secure connection.  By engaging in this virtual visit, you consent to the provision of healthcare and authorize for your insurance to be billed (if applicable) for the services provided during this visit. Depending on your insurance coverage, you may receive a charge related to this service.  I need to obtain your verbal consent now. Are you willing to proceed with your visit today? Kennya Ojo has provided verbal consent on 08/06/2022 for a virtual visit (video or telephone). Evelina Dun, FNP  Date: 08/06/2022 12:15 PM  Virtual Visit via Video Note   I, Evelina Dun, connected with  Kanasha Dessources  ([id number], [birth year]) on 08/06/22 at 12:15 PM EDT by a video-enabled telemedicine application and verified that I am speaking with the correct person using two identifiers.  Location: Patient: Virtual Visit Location Patient:  Home Provider: Virtual Visit Location Provider: Home Office   I discussed the limitations of evaluation and management by telemedicine and the availability of in person appointments. The patient expressed understanding and agreed to proceed.    History of Present Illness: Suzanne Wolfe is a 27 y.o. 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 She reports she has recently been tested positive in January.. She noticed once lesion vaginally with 8 burning pain.   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   Other constipation 06/28/2015   Rectal bleeding 06/28/2015   Infected pilonidal cyst 10/22/2014   Pilonidal sinus 07/02/2014   Episodic tension-type headache, not intractable 12/04/2013   Migraine without aura and without status migrainosus, not intractable 12/04/2013    Allergies: No Known Allergies Medications:  Current Outpatient Medications:    lidocaine (XYLOCAINE) 5 % ointment, Apply 1 Application topically as needed., Disp: 35.44 g, Rfl: 0   valACYclovir (VALTREX) 1000 MG tablet, Take 1 tablet (1,000 mg total) by mouth 2 (two) times daily for 10 days., Disp: 20 tablet, Rfl: 0   acetaminophen (TYLENOL) 500 MG tablet, Take 500 mg by mouth every 6 (six) hours as needed., Disp: , Rfl:    albuterol (VENTOLIN HFA) 108 (90 Base) MCG/ACT inhaler, Inhale 1-2 puffs into the lungs every 6 (six) hours  as needed for wheezing or shortness of breath., Disp: 1 each, Rfl: 0   ALBUTEROL IN, Inhale into the lungs., Disp: , Rfl:    fluconazole (DIFLUCAN) 150 MG tablet, Take 1 tablet (150 mg total) by mouth every 3 (three) days as needed., Disp: 2 tablet, Rfl: 0   lidocaine (XYLOCAINE) 2 % jelly, Apply 1 Application topically as needed., Disp: 20 mL, Rfl: 0   metroNIDAZOLE  (FLAGYL) 500 MG tablet, Take 1 tablet (500 mg total) by mouth 2 (two) times daily., Disp: 14 tablet, Rfl: 0   nitrofurantoin, macrocrystal-monohydrate, (MACROBID) 100 MG capsule, Take 1 capsule (100 mg total) by mouth 2 (two) times daily., Disp: 10 capsule, Rfl: 0   norgestimate-ethinyl estradiol (ORTHO-CYCLEN) 0.25-35 MG-MCG tablet, Take 1 tablet by mouth daily., Disp: , Rfl:   Observations/Objective: Patient is well-developed, well-nourished in no acute distress.  Resting comfortably  at home.  Head is normocephalic, atraumatic.  No labored breathing.  Speech is clear and coherent with logical content.  Patient is alert and oriented at baseline.    Assessment and Plan: 1. Herpes simplex vulvovaginitis - valACYclovir (VALTREX) 1000 MG tablet; Take 1 tablet (1,000 mg total) by mouth 2 (two) times daily for 10 days.  Dispense: 20 tablet; Refill: 0 - lidocaine (XYLOCAINE) 5 % ointment; Apply 1 Application topically as needed.  Dispense: 35.44 g; Refill: 0  Start Valtrex today Lidocaine as needed Safe sex Keep follow up with PCP   Follow Up Instructions: I discussed the assessment and treatment plan with the patient. The patient was provided an opportunity to ask questions and all were answered. The patient agreed with the plan and demonstrated an understanding of the instructions.  A copy of instructions were sent to the patient via MyChart unless otherwise noted below.     The patient was advised to call back or seek an in-person evaluation if the symptoms worsen or if the condition fails to improve as anticipated.  Time:  I spent 11 minutes with the patient via telehealth technology discussing the above problems/concerns.    Evelina Dun, FNP

## 2022-08-26 ENCOUNTER — Ambulatory Visit (HOSPITAL_COMMUNITY): Payer: 59

## 2022-09-15 ENCOUNTER — Encounter (HOSPITAL_COMMUNITY): Payer: Self-pay

## 2022-09-15 ENCOUNTER — Ambulatory Visit (HOSPITAL_COMMUNITY)
Admission: RE | Admit: 2022-09-15 | Discharge: 2022-09-15 | Disposition: A | Discharge status: Home / Self Care | Payer: 59 | Source: Ambulatory Visit | Attending: Emergency Medicine | Admitting: Emergency Medicine

## 2022-09-15 VITALS — BP 109/71 | HR 64 | Temp 98.6°F | Resp 18

## 2022-09-15 DIAGNOSIS — Z113 Encounter for screening for infections with a predominantly sexual mode of transmission: Secondary | ICD-10-CM | POA: Diagnosis not present

## 2022-09-15 DIAGNOSIS — N760 Acute vaginitis: Secondary | ICD-10-CM | POA: Insufficient documentation

## 2022-09-15 LAB — POCT URINALYSIS DIP (MANUAL ENTRY)
Bilirubin, UA: NEGATIVE
Glucose, UA: NEGATIVE mg/dL
Ketones, POC UA: NEGATIVE mg/dL
Leukocytes, UA: NEGATIVE
Nitrite, UA: NEGATIVE
Protein Ur, POC: NEGATIVE mg/dL
Spec Grav, UA: 1.025 (ref 1.010–1.025)
Urobilinogen, UA: 0.2 E.U./dL
pH, UA: 6.5 (ref 5.0–8.0)

## 2022-09-15 LAB — POCT URINE PREGNANCY: Preg Test, Ur: NEGATIVE

## 2022-09-15 LAB — HIV ANTIBODY (ROUTINE TESTING W REFLEX): HIV Screen 4th Generation wRfx: NONREACTIVE

## 2022-09-15 NOTE — ED Triage Notes (Signed)
Patient having vaginal discharge, bleeding, and odor. States this is early for her period and they are normally not like this. Has sex 2 weeks ago and the condom had broken.

## 2022-09-15 NOTE — ED Provider Notes (Signed)
[facility]    CSN: [id number] Arrival date & time: 09/15/22  1417      History   Chief Complaint Chief Complaint  Patient presents with   Exposure to STD    I need to get std tested , I'm having discharge that looks like my period but it's been just spotting and vaginal odor - Entered by patient    HPI Suzanne Wolfe is a 27 y.o. female.   Patient presents to clinic for concerns of a vaginal odor and spotting. She was last sexually active 2 weeks ago, her and her partner were using a condom and did not notice that the condom broke and until after they were done with intercourse.  She took a Plan B the next morning. She is no longer on combined oral contraceptives, she has been off of these for the past few months.  She ran out and has been waiting to get in with her primary care provider. She denies any changes to discharge, denies dysuria, denies fevers, or hematuria. She has been spotting the past few days, this is abnormal for her.  Normally her menses comes on fully without spotting.  Would like HIV and Syphilis screening.      The history is provided by the patient and medical records.  Exposure to STD Pertinent negatives include no abdominal pain.    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/31/2016   Other constipation 06/28/2015   Rectal bleeding 06/28/2015   Infected pilonidal cyst 10/22/2014   Pilonidal sinus 07/02/2014   Episodic tension-type headache, not intractable 12/04/2013   Migraine without aura and without status migrainosus, not intractable 12/04/2013    Past Surgical History:  Procedure Laterality Date   COLONOSCOPY   08-04-2016  dr stark   ESOPHAGOGASTRODUODENOSCOPY  06/20/2016   LAPAROSCOPIC APPENDECTOMY  07/16/2009   MEDIAL PATELLOFEMORAL LIGAMENT REPAIR Right 10/31/2016   Procedure: Right knee arthroscopic assisted medial patella femoral ligament reconstruction;  Surgeon: Yolonda Kida, MD;  Location: [facility];  Service: Orthopedics;  Laterality: Right;   PILONIDAL CYST EXCISION N/A 07/02/2014   Procedure: EXCISION OF PILONIDAL CYST WITH PRIMARY CLOSURE;  Surgeon: Judie Petit. Leonia Corona, MD;  Location: [facility];  Service: Pediatrics;  Laterality: N/A;   PILONIDAL CYST EXCISION N/A 10/22/2014   Procedure: EXCISION PILONIDAL CYST AND SINUS;  Surgeon: Leonia Corona, MD;  Location: [facility];  Service: Pediatrics;  Laterality: N/A;  Re-excision   WISDOM TOOTH EXTRACTION      OB History     Gravida  1   Para      Term      Preterm      AB      Living         SAB      IAB      Ectopic      Multiple      Live Births               Home Medications    Prior to Admission medications   Medication Sig Start Date End Date Taking? Authorizing Provider  albuterol (VENTOLIN HFA) 108 (90 Base)  MCG/ACT inhaler Inhale 1-2 puffs into the lungs every 6 (six) hours as needed for wheezing or shortness of breath. 06/11/21  Yes Rhys Martini, PA-C  ibuprofen (ADVIL) 800 MG tablet Take 800 mg by mouth every 6 (six) hours as needed. 09/04/22  Yes [provider]  traMADol (ULTRAM) 50 MG tablet Take 50 mg by mouth every 6 (six) hours as needed for moderate pain. 09/04/22  Yes [provider]    Family History Family History  Problem Relation Age of Onset   Healthy Mother    Asthma Father    Diabetes Maternal Grandmother    Hypertension Maternal Grandmother    Heart disease Maternal Grandmother        hx. open heart surgery   Colon cancer Neg Hx    Esophageal cancer Neg Hx     Social History Social History   Tobacco Use    Smoking status: Never   Smokeless tobacco: Never  Vaping Use   Vaping Use: Never used  Substance Use Topics   Alcohol use: No   Drug use: No     Allergies   Patient has no known allergies.   Review of Systems Review of Systems  Constitutional:  Negative for chills, fatigue and fever.  Gastrointestinal:  Negative for abdominal pain.  Genitourinary:  Positive for menstrual problem and vaginal bleeding. Negative for dysuria, flank pain, genital sores, hematuria, vaginal discharge and vaginal pain.     Physical Exam Triage Vital Signs ED Triage Vitals  Enc Vitals Group     BP 09/15/22 1429 109/71     Pulse Rate 09/15/22 1429 64     Resp 09/15/22 1429 18     Temp 09/15/22 1429 98.6 F (37 C)     Temp Source 09/15/22 1429 Oral     SpO2 09/15/22 1429 97 %     Weight --      Height --      Head Circumference --      Peak Flow --      Pain Score 09/15/22 1427 2     Pain Loc --      Pain Edu? --      Excl. in GC? --    No data found.  Updated Vital Signs BP 109/71 (BP Location: Right Arm)   Pulse 64   Temp 98.6 F (37 C) (Oral)   Resp 18   LMP 08/21/2022 (Approximate)   SpO2 97%   Visual Acuity Right Eye Distance:   Left Eye Distance:   Bilateral Distance:    Right Eye Near:   Left Eye Near:    Bilateral Near:     Physical Exam Vitals and nursing note reviewed.  Constitutional:      Appearance: Normal appearance.  HENT:     Head: Normocephalic and atraumatic.     Right Ear: External ear normal.     Left Ear: External ear normal.     Nose: Nose normal.     Mouth/Throat:     Mouth: Mucous membranes are moist.  Eyes:     Conjunctiva/sclera: Conjunctivae normal.  Cardiovascular:     Rate and Rhythm: Normal rate and regular rhythm.  Pulmonary:     Effort: Pulmonary effort is normal. No respiratory distress.  Musculoskeletal:        General: No swelling. Normal range of motion.  Neurological:     General: No focal deficit present.     Mental  Status: She is alert and oriented to person,  place, and time.  Psychiatric:        Mood and Affect: Mood normal.        Behavior: Behavior normal.      UC Treatments / Results  Labs (all labs ordered are listed, but only abnormal results are displayed) Labs Reviewed  POCT URINALYSIS DIP (MANUAL ENTRY) - Abnormal; Notable for the following components:      Result Value   Blood, UA small (*)    All other components within normal limits  RPR  HIV ANTIBODY (ROUTINE TESTING W REFLEX)  POCT URINE PREGNANCY  CERVICOVAGINAL ANCILLARY ONLY    EKG   Radiology No results found.  Procedures Procedures (including critical care time)  Medications Ordered in UC Medications - No data to display  Initial Impression / Assessment and Plan / UC Course  I have reviewed the triage vital signs and the nursing notes.  Pertinent labs & imaging results that were available during my care of the patient were reviewed by me and considered in my medical decision making (see chart for details).  Vitals and triage reviewed, patient is hemodynamically stable.  Presents to clinic over concern of vaginal spotting and odor.  Cytology swab obtained.  HIV and syphilis screening obtained.  Urine pregnancy negative. Urinalysis with red blood cells, consistent with spotting.  Suspect Plan B has altered menstrual cycle.  Discussed return precautions and follow-up care, no questions at this time.     Final Clinical Impressions(s) / UC Diagnoses   Final diagnoses:  Vaginitis and vulvovaginitis  Screening examination for sexually transmitted disease     Discharge Instructions      Your urine pregnancy was negative.  Your urinalysis was without a urinary tract infection.  We have screened you for yeast, BV, gonorrhea, chlamydia and trichomoniasis.  We have also obtained labs to screen for HIV and syphilis.  We will contact you if anything results as abnormal and initiate the appropriate treatment.  Please  abstain from intercourse until all results are received.  Please return to clinic if you develop any new or concerning symptoms.      ED Prescriptions   None    PDMP not reviewed this encounter.   Armando Bukhari, [place], [place] 09/15/22 [phone]

## 2022-09-15 NOTE — Discharge Instructions (Addendum)
Your urine pregnancy was negative.  Your urinalysis was without a urinary tract infection.  We have screened you for yeast, BV, gonorrhea, chlamydia and trichomoniasis.  We have also obtained labs to screen for HIV and syphilis.  We will contact you if anything results as abnormal and initiate the appropriate treatment.  Please abstain from intercourse until all results are received.  Please return to clinic if you develop any new or concerning symptoms.

## 2022-09-16 LAB — RPR: RPR Ser Ql: NONREACTIVE

## 2022-09-18 ENCOUNTER — Telehealth (HOSPITAL_COMMUNITY): Payer: Self-pay | Admitting: Emergency Medicine

## 2022-09-18 LAB — CERVICOVAGINAL ANCILLARY ONLY
Bacterial Vaginitis (gardnerella): POSITIVE — AB
Candida Glabrata: NEGATIVE
Candida Vaginitis: POSITIVE — AB
Chlamydia: NEGATIVE
Comment: NEGATIVE
Comment: NEGATIVE
Comment: NEGATIVE
Comment: NEGATIVE
Comment: NEGATIVE
Comment: NORMAL
Neisseria Gonorrhea: NEGATIVE
Trichomonas: NEGATIVE

## 2022-09-18 MED ORDER — FLUCONAZOLE 150 MG PO TABS: 150.0000 mg | ORAL_TABLET | Freq: Once | ORAL | 0 refills | Status: AC

## 2022-09-18 MED ORDER — METRONIDAZOLE 500 MG PO TABS: 500.0000 mg | ORAL_TABLET | Freq: Two times a day (BID) | ORAL | 0 refills | Status: DC

## 2022-09-22 ENCOUNTER — Ambulatory Visit (HOSPITAL_COMMUNITY)
Admission: RE | Admit: 2022-09-22 | Discharge: 2022-09-22 | Disposition: A | Discharge status: Home / Self Care | Payer: 59 | Source: Ambulatory Visit | Attending: Emergency Medicine | Admitting: Emergency Medicine

## 2022-09-22 ENCOUNTER — Encounter (HOSPITAL_COMMUNITY): Payer: Self-pay

## 2022-09-22 VITALS — BP 117/82 | HR 95 | Temp 98.5°F | Resp 18

## 2022-09-22 DIAGNOSIS — J4521 Mild intermittent asthma with (acute) exacerbation: Secondary | ICD-10-CM

## 2022-09-22 DIAGNOSIS — J069 Acute upper respiratory infection, unspecified: Secondary | ICD-10-CM

## 2022-09-22 MED ORDER — METHYLPREDNISOLONE SODIUM SUCC 125 MG IJ SOLR
INTRAMUSCULAR | Status: AC
  Filled 2022-09-22: qty 2

## 2022-09-22 MED ORDER — ALBUTEROL SULFATE HFA 108 (90 BASE) MCG/ACT IN AERS: 1.0000 | INHALATION_SPRAY | Freq: Four times a day (QID) | RESPIRATORY_TRACT | 0 refills | Status: DC | PRN

## 2022-09-22 MED ORDER — PREDNISONE 20 MG PO TABS: 40.0000 mg | ORAL_TABLET | Freq: Every day | ORAL | 0 refills | Status: AC

## 2022-09-22 MED ORDER — ALBUTEROL SULFATE (5 MG/ML) 0.5% IN NEBU: 2.5000 mg | INHALATION_SOLUTION | Freq: Four times a day (QID) | RESPIRATORY_TRACT | 12 refills | Status: DC | PRN

## 2022-09-22 MED ORDER — BENZONATATE 100 MG PO CAPS: 100.0000 mg | ORAL_CAPSULE | Freq: Three times a day (TID) | ORAL | 0 refills | Status: DC

## 2022-09-22 MED ORDER — METHYLPREDNISOLONE SODIUM SUCC 125 MG IJ SOLR
60.0000 mg | Freq: Once | INTRAMUSCULAR | Status: AC
  Administered 2022-09-22: 60 mg via INTRAMUSCULAR

## 2022-09-22 MED ORDER — GUAIFENESIN ER 600 MG PO TB12: 1200.0000 mg | ORAL_TABLET | Freq: Two times a day (BID) | ORAL | 0 refills | Status: AC

## 2022-09-22 NOTE — ED Provider Notes (Signed)
[facility]    CSN: [id number] Arrival date & time: 09/22/22  1448      History   Chief Complaint Chief Complaint  Patient presents with   Appointment    I have had fever, chills, cough, nausea, vomiting, and fatigue. - Entered by patient    HPI Ellery Lubas is a 27 y.o. female.   Patient presents to clinic for concerns of sore throat, nonproductive cough, fever and nasal congestion with chest tightness for the past two days. She has had fevers, temp of 101.4 the other night.  She has been taking Tylenol, last took this morning.  She did wake up during the night sweating.  She reports chest tightness, history of asthma.  She only has a few puffs left alone in her inhaler and she last used her nebulizer treatment last night.  Not been into her primary care provider for refills on these medications.   Denies any recent known sick contacts.  The history is provided by the patient and medical records.    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/31/2016   Other constipation 06/28/2015   Rectal bleeding 06/28/2015   Infected pilonidal cyst 10/22/2014   Pilonidal sinus 07/02/2014   Episodic tension-type headache, not intractable 12/04/2013   Migraine without aura and without status migrainosus, not intractable 12/04/2013    Past Surgical History:  Procedure Laterality Date   COLONOSCOPY  08-04-2016  dr stark   ESOPHAGOGASTRODUODENOSCOPY  06/20/2016   LAPAROSCOPIC APPENDECTOMY  07/16/2009   MEDIAL PATELLOFEMORAL LIGAMENT REPAIR Right 10/31/2016   Procedure: Right knee arthroscopic assisted medial patella femoral ligament reconstruction;  Surgeon: Yolonda Kida, MD;  Location[facility];  Service: Orthopedics;  Laterality: Right;   PILONIDAL CYST EXCISION N/A 07/02/2014   Procedure: EXCISION OF PILONIDAL CYST WITH PRIMARY CLOSURE;  Surgeon: Judie Petit. Leonia Corona, MD;  Location: [facility];  Service: Pediatrics;  Laterality: N/A;   PILONIDAL CYST EXCISION N/A 10/22/2014   Procedure: EXCISION PILONIDAL CYST AND SINUS;  Surgeon: Leonia Corona, MD;  Location: [facility];  Service: Pediatrics;  Laterality: N/A;  Re-excision   WISDOM TOOTH EXTRACTION      OB History     Gravida  1   Para      Term      Preterm      AB      Living         SAB      IAB      Ectopic      Multiple      Live Births               Home Medications    Prior to Admission medications   Medication Sig Start Date End Date Taking? Authorizing Provider  albuterol (PROVENTIL) (5 MG/ML) 0.5% nebulizer solution Take 0.5 mLs (2.5 mg total) by nebulization every 6 (six) hours as needed for wheezing or shortness of breath. 09/22/22  Yes Rinaldo Ratel, [place] N, FNP  albuterol (VENTOLIN HFA) 108 (90 Base) MCG/ACT inhaler Inhale 1-2 puffs into the lungs every 6 (six) hours as needed for wheezing or shortness of breath. 09/22/22  Yes Rinaldo Ratel, [place] N, FNP  benzonatate (TESSALON) 100 MG capsule Take 1 capsule (100 mg total) by mouth every 8 (eight) hours. 09/22/22  Yes Rinaldo Ratel, [place] N, FNP  guaiFENesin (MUCINEX) 600 MG 12 hr tablet Take 2 tablets (1,200 mg total) by mouth 2 (two) times daily for 5 days. 09/22/22 09/27/22 Yes Rinaldo Ratel, [place] N, FNP  predniSONE (DELTASONE) 20 MG tablet Take 2 tablets (40 mg total) by mouth daily with breakfast for 5 days. 09/22/22 09/27/22 Yes Latish Toutant, [place] N, FNP    Family History Family History  Problem Relation Age of Onset   Healthy Mother    Asthma Father    Diabetes Maternal Grandmother    Hypertension Maternal Grandmother    Heart disease Maternal Grandmother        hx.  open heart surgery   Colon cancer Neg Hx    Esophageal cancer Neg Hx     Social History Social History   Tobacco Use   Smoking status: Never   Smokeless tobacco: Never  Vaping Use   Vaping Use: Never used  Substance Use Topics   Alcohol use: No   Drug use: No     Allergies   Patient has no known allergies.   Review of Systems Review of Systems  Constitutional:  Positive for chills and fever.  HENT:  Positive for congestion, postnasal drip, rhinorrhea and sore throat.   Respiratory:  Positive for cough, shortness of breath and wheezing.   Cardiovascular:  Negative for chest pain.  Gastrointestinal:  Negative for abdominal pain.     Physical Exam Triage Vital Signs ED Triage Vitals  Enc Vitals Group     BP 09/22/22 1516 117/82     Pulse Rate 09/22/22 1516 95     Resp 09/22/22 1516 18     Temp 09/22/22 1516 98.5 F (36.9 C)     Temp Source 09/22/22 1516 Oral     SpO2 09/22/22 1516 96 %     Weight --      Height --      Head Circumference --      Peak Flow --      Pain Score 09/22/22 1514 4     Pain Loc --      Pain Edu? --      Excl. in GC? --    No data found.  Updated Vital Signs BP 117/82 (BP Location: Right Arm)   Pulse 95   Temp 98.5 F (36.9 C) (Oral)   Resp 18   LMP 09/14/2022 (Approximate)   SpO2 96%   Visual Acuity Right Eye Distance:   Left Eye Distance:   Bilateral Distance:    Right Eye Near:   Left Eye Near:    Bilateral Near:     Physical Exam Vitals and nursing note reviewed.  Constitutional:      Appearance: Normal appearance.  HENT:     Head: Normocephalic and atraumatic.     Right Ear: External ear normal.     Left Ear: External ear normal.     Nose: Congestion and rhinorrhea present.     Mouth/Throat:     Mouth: Mucous membranes are moist.     Pharynx: Posterior oropharyngeal erythema present.  Eyes:     Conjunctiva/sclera: Conjunctivae normal.  Cardiovascular:     Rate and Rhythm: Normal rate and regular rhythm.      Pulses: Normal pulses.     Heart sounds: Normal heart sounds. No murmur heard. Pulmonary:  Effort: Pulmonary effort is normal. No respiratory distress.     Breath sounds: Normal breath sounds.  Musculoskeletal:     Cervical back: Normal range of motion.  Lymphadenopathy:     Cervical: Cervical adenopathy present.  Skin:    General: Skin is warm and dry.  Neurological:     General: No focal deficit present.     Mental Status: She is alert and oriented to person, place, and time.  Psychiatric:        Mood and Affect: Mood normal.        Behavior: Behavior normal.      UC Treatments / Results  Labs (all labs ordered are listed, but only abnormal results are displayed) Labs Reviewed - No data to display  EKG   Radiology No results found.  Procedures Procedures (including critical care time)  Medications Ordered in UC Medications  methylPREDNISolone sodium succinate (SOLU-MEDROL) 125 mg/2 mL injection 60 mg (has no administration in time range)    Initial Impression / Assessment and Plan / UC Course  I have reviewed the triage vital signs and the nursing notes.  Pertinent labs & imaging results that were available during my care of the patient were reviewed by me and considered in my medical decision making (see chart for details).  Vitals and triage reviewed, patient is hemodynamically stable.  Symptoms consistent with a viral upper respiratory tract infection, suspect asthma exacerbation with chest tightness and shortness of breath.  Oxygenation stable on room air, lungs vesicular, imaging deferred, low concern for pneumonia.  Will give IM steroid in clinic, steroid burst and supportive medications.  Plan of care, return precautions and follow-up care discussed, patient verbalized understanding, no questions at this time.     Final Clinical Impressions(s) / UC Diagnoses   Final diagnoses:  Mild intermittent asthma with acute exacerbation  Viral URI with cough      Discharge Instructions      Your symptoms are consistent with a mild asthma exacerbation, most likely due to a viral upper respiratory infection.  I am refilling your albuterol inhaler and nebulizer treatment, you can do these as needed.  You can trial the Tessalon Perles for cough suppression, every 8 hours.  Please use the Mucinex 1200 mg daily, ensure you are drinking at least 64 ounces of water.  You can also sleep with a humidifier to help loosen your secretions.  Take the steroids every morning with breakfast.  Please return to clinic if you develop worsening shortness of breath, chest pain, wheezing, fevers despite medication, or any new concerning symptoms.      ED Prescriptions     Medication Sig Dispense Auth. Provider   albuterol (VENTOLIN HFA) 108 (90 Base) MCG/ACT inhaler Inhale 1-2 puffs into the lungs every 6 (six) hours as needed for wheezing or shortness of breath. 18 g Rinaldo Ratel, [place] N, FNP   albuterol (PROVENTIL) (5 MG/ML) 0.5% nebulizer solution Take 0.5 mLs (2.5 mg total) by nebulization every 6 (six) hours as needed for wheezing or shortness of breath. 20 mL Rinaldo Ratel, [place] N, FNP   benzonatate (TESSALON) 100 MG capsule Take 1 capsule (100 mg total) by mouth every 8 (eight) hours. 21 capsule Rinaldo Ratel, [place] N, [place]   predniSONE (DELTASONE) 20 MG tablet Take 2 tablets (40 mg total) by mouth daily with breakfast for 5 days. 10 tablet Rinaldo Ratel, [place] N, FNP   guaiFENesin (MUCINEX) 600 MG 12 hr tablet Take 2 tablets (1,200 mg total) by mouth 2 (two) times daily  for 5 days. 20 tablet Ormand Senn, [place] N, [place]      PDMP not reviewed this encounter.   Sarea Fyfe, [place] N, [place] 09/22/22 1536

## 2022-09-22 NOTE — ED Triage Notes (Signed)
Sore throat, emesis with coughing, fever, cough, runny nose, and congestion with chest tightness. No known sick exposure and no recent travel.  Onset 2 days ago.   Patient has been using nebulizer, inhaler, Dayquil vapor cool, and tylenol with slight relief.

## 2022-09-22 NOTE — Discharge Instructions (Addendum)
Your symptoms are consistent with a mild asthma exacerbation, most likely due to a viral upper respiratory infection.  I am refilling your albuterol inhaler and nebulizer treatment, you can do these as needed.  You can trial the Tessalon Perles for cough suppression, every 8 hours.  Please use the Mucinex 1200 mg daily, ensure you are drinking at least 64 ounces of water.  You can also sleep with a humidifier to help loosen your secretions.  Take the steroids every morning with breakfast.  Please return to clinic if you develop worsening shortness of breath, chest pain, wheezing, fevers despite medication, or any new concerning symptoms.

## 2022-09-25 ENCOUNTER — Ambulatory Visit: Payer: 59 | Admitting: Internal Medicine

## 2022-10-10 ENCOUNTER — Telehealth: Payer: 59 | Admitting: Physician Assistant

## 2022-10-10 DIAGNOSIS — A6004 Herpesviral vulvovaginitis: Secondary | ICD-10-CM

## 2022-10-10 MED ORDER — VALACYCLOVIR HCL 500 MG PO TABS
500.0000 mg | ORAL_TABLET | Freq: Two times a day (BID) | ORAL | 0 refills | Status: DC
Start: 2022-10-10 — End: 2022-11-09

## 2022-10-10 NOTE — Progress Notes (Signed)
I have spent 5 minutes in review of e-visit questionnaire, review and updating patient chart, medical decision making and response to patient.   Fe Okubo Cody Kealy Lewter, PA-C    

## 2022-10-10 NOTE — Progress Notes (Signed)
E-Visit for Herpes Simplex  We are sorry that you are not feeling well.  Here is how we plan to help!  Based on what you have shared ith me, it looks like you may be having an outbreak/flare-up of genital herpes.    I have prescribed I have prescribed Valacyclovir 500 mg Take one by mouth twice a day for 3 days.    If you have been prescribed long term medications to be taken on a regular basis, it is important to follow the recommendations and take them as ordered.    Outbreaks usually include blisters and open sores in the genital area. Outbreaks that happen after the first time are usually not as severe and do not last as long. Genital Herpes Simplex is a commonly sexually transmitted viral infection that is found worldwide. Most of these genital infections are caused by one or two herpes simplex viruses that is passed from person to person during vaginal, oral, or anal sex. Sometimes, people do not know they have herpes because they do not have any symptoms.  Please be aware that if you have genital herpes you can be contagious even when you are not having rash or flare-up and you may not have any symptoms, even when you are taking suppressive medicines.  Herpes cannot be cured. The disease usually causes most problems during the first few years. After that, the virus is still there, but it causes few to no symptoms. Even when the virus is active, people with herpes can take medicines to reduce and help prevent symptoms.  Herpes is an infection that can cause blisters and open sores on the genital area. Herpes is caused by a virus that is passed from person to person during vaginal, oral, or anal sex. Sometimes, people do not know they have herpes because they do not have any symptoms. Herpes cannot be cured. The disease usually causes most problems during the first few years. After that, the virus is still there, but it causes few to no symptoms. Even when the virus is active, people with herpes  can take medicines to reduce and help prevent symptoms.  If you have been prescribed medications to be taken on a regular basis, it is important to follow the recommendations and take them as ordered.  Some people with herpes never have any symptoms. But other people can develop symptoms within a few weeks of being infected with the herpes virus   Symptoms usually include blisters in the genital area. In women, this area includes the vagina, buttocks, anus, or thighs. In men, this area includes the penis, scrotum, anus, butt, or thighs. The blisters can become painful open sores, which then crust over as they heal. Sometimes, people can have other symptoms that include:  ?Blisters on the mouth or lips ?Fever, headache, or pain in the joints ?Trouble urinating  Outbreaks might occur every month or more often, or just once or twice a year. Sometimes, people can tell when an outbreak will occur, because they feel itching or pain beforehand. Sometimes they do not know that an outbreak is coming because they have no symptoms. Whatever your pattern is, keep in mind that herpes outbreaks usually become less frequent over time as you get older. Certain things, called "triggers," can make outbreaks more likely to occur. These include stress, sunlight, menstrual periods,or getting sick.  Antiviral therapy can shorten the duration of symptoms and signs in primary infection, which, when untreated, can be associated with significant increase in the   symptoms of the disease.  HOME CARE Use a portable bath (such as a "Sitz bath") where you can sit in warm water for about 20 minutes. Your bathtub could also work. Avoid bubble baths.  Keep the genital area clean and dry and avoid tight clothes.  Take over-the-counter pain medicine such as acetaminophen (brand name: Tylenol) or ibuprofen sample brand names: Advil, Motrin). But avoid aspirin.  Only take medications as instructed by your medical team.  You are  most likely to spread herpes to a sex partner when you have blisters and open sores on your body. But it's also possible to spread herpes to your partner when you do not have any symptoms. That is because herpes can be present on your body without causing any symptoms, like blisters or pain.  Telling your sex partner that you have herpes can be hard. But it can help protect them, since there are ways to lower the risk of spreading the infection.   Using a condom every time you have sex  Not having sex when you have symptoms  Not having oral sex if you have blisters or open sores (in the genital area or around your mouth)  MAKE SURE YOU   Understand these instructions. Do not have sex without using a condom until you have been seen by a doctor and as instructed by the provider If you are not better or improved within 7 days, you MUST have a follow up at your doctor or the health department for evaluation. There are other causes of rashes in the genital region.  Thank you for choosing an e-visit.  Your e-visit answers were reviewed by a board certified advanced clinical practitioner to complete your personal care plan. Depending upon the condition, your plan could have included both over the counter or prescription medications.  Please review your pharmacy choice. Make sure the pharmacy is open so you can pick up prescription now. If there is a problem, you may contact your provider through MyChart messaging and have the prescription routed to another pharmacy.  Your safety is important to us. If you have drug allergies check your prescription carefully.   For the next 24 hours you can use MyChart to ask questions about today's visit, request a non-urgent call back, or ask for a work or school excuse. You will get an email in the next two days asking about your experience. I hope that your e-visit has been valuable and will speed your recovery.      

## 2022-10-21 ENCOUNTER — Telehealth: Payer: 59 | Admitting: Nurse Practitioner

## 2022-10-21 DIAGNOSIS — B3731 Acute candidiasis of vulva and vagina: Secondary | ICD-10-CM | POA: Diagnosis not present

## 2022-10-21 MED ORDER — FLUCONAZOLE 150 MG PO TABS: 150.0000 mg | ORAL_TABLET | Freq: Once | ORAL | 0 refills | Status: AC

## 2022-10-21 NOTE — Progress Notes (Signed)

## 2022-10-27 ENCOUNTER — Telehealth: Payer: Self-pay | Admitting: *Deleted

## 2022-10-27 NOTE — Telephone Encounter (Signed)
Returned call from 10/26/2022 at 2:03 PM. Left patient a message to call the office to schedule a New OB appointment and or to receive contact information for any of the [place] offices since she lives in [place].

## 2022-11-09 ENCOUNTER — Encounter: Payer: Self-pay | Admitting: Internal Medicine

## 2022-11-09 ENCOUNTER — Ambulatory Visit: Payer: 59 | Admitting: Internal Medicine

## 2022-11-09 VITALS — BP 112/64 | HR 74 | Temp 99.3°F | Ht 60.0 in | Wt 173.0 lb

## 2022-11-09 DIAGNOSIS — Z3A01 Less than 8 weeks gestation of pregnancy: Secondary | ICD-10-CM

## 2022-11-09 DIAGNOSIS — F988 Other specified behavioral and emotional disorders with onset usually occurring in childhood and adolescence: Secondary | ICD-10-CM | POA: Diagnosis not present

## 2022-11-09 DIAGNOSIS — J4540 Moderate persistent asthma, uncomplicated: Secondary | ICD-10-CM | POA: Diagnosis not present

## 2022-11-09 DIAGNOSIS — B009 Herpesviral infection, unspecified: Secondary | ICD-10-CM | POA: Diagnosis not present

## 2022-11-09 DIAGNOSIS — Z3490 Encounter for supervision of normal pregnancy, unspecified, unspecified trimester: Secondary | ICD-10-CM | POA: Insufficient documentation

## 2022-11-09 DIAGNOSIS — A6004 Herpesviral vulvovaginitis: Secondary | ICD-10-CM

## 2022-11-09 MED ORDER — VALACYCLOVIR HCL 1 G PO TABS: 1000.0000 mg | ORAL_TABLET | Freq: Every day | ORAL | 3 refills | Status: DC

## 2022-11-09 NOTE — Patient Instructions (Signed)
We have sent in the valtrex to take 1 pill daily for prevention.  Let [place] know or come back to start birth control and strattera for the add.

## 2022-11-09 NOTE — Assessment & Plan Note (Signed)
Rx valtrex 1000 mg daily for prevention as she has had multiple flares in the last year. Discussed that this also lowers risk of transmission.

## 2022-11-09 NOTE — Assessment & Plan Note (Signed)
She was planning to start birth control but is currently about [redacted] weeks pregnant. Not intending to continue with this and [facility]. She is aware that we are unable to start birth control during pregnancy. She has previously had implant and did not tolerate and depo-provera did not tolerate. Did well with ocps but ran out.

## 2022-11-09 NOTE — Assessment & Plan Note (Signed)
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 Given current pregnancy will not treat at this time. She will let us know about this and we discussed starting strattera 40 mg daily 1 month then increase to 80 mg daily if needed.

## 2022-11-09 NOTE — Progress Notes (Signed)
   Subjective:   Patient ID: Suzanne Wolfe, female    DOB: [birth year], 27 y.o.   MRN: [id number]  HPI The patient is a new 27 YO female coming in for follow up ongoing care and questions.   PMH, [facility], social history reviewed and updated  Review of Systems  Constitutional: Negative.   HENT: Negative.    Eyes: Negative.   Respiratory:  Negative for cough, chest tightness and shortness of breath.   Cardiovascular:  Negative for chest pain, palpitations and leg swelling.  Gastrointestinal:  Negative for abdominal distention, abdominal pain, constipation, diarrhea, nausea and vomiting.  Musculoskeletal: Negative.   Skin: Negative.   Neurological: Negative.   Psychiatric/Behavioral: Negative.      Objective:  Physical Exam Constitutional:      Appearance: She is well-developed.  HENT:     Head: Normocephalic and atraumatic.  Cardiovascular:     Rate and Rhythm: Normal rate and regular rhythm.  Pulmonary:     Effort: Pulmonary effort is normal. No respiratory distress.     Breath sounds: Normal breath sounds. No wheezing or rales.  Abdominal:     General: Bowel sounds are normal. There is no distension.     Palpations: Abdomen is soft.     Tenderness: There is no abdominal tenderness. There is no rebound.  Musculoskeletal:     Cervical back: Normal range of motion.  Skin:    General: Skin is warm and dry.  Neurological:     Mental Status: She is alert and oriented to person, place, and time.     Coordination: Coordination normal.     Vitals:   11/09/22 0804  BP: 112/64  Pulse: 74  Temp: 99.3 F (37.4 C)  TempSrc: Oral  SpO2: 98%  Weight: 173 lb (78.5 kg)  Height: 5' (1.524 m)    Assessment & Plan:

## 2022-11-09 NOTE — Assessment & Plan Note (Signed)
Using albuterol prn and typically does well other than when she has URI.

## 2022-11-21 HISTORY — PX: DILATION AND CURETTAGE OF UTERUS: SHX78

## 2022-11-23 ENCOUNTER — Ambulatory Visit (HOSPITAL_COMMUNITY)
Admission: RE | Admit: 2022-11-23 | Discharge: 2022-11-23 | Disposition: A | Discharge status: Home / Self Care | Payer: 59 | Source: Ambulatory Visit | Attending: Internal Medicine | Admitting: Internal Medicine

## 2022-11-23 ENCOUNTER — Inpatient Hospital Stay (HOSPITAL_COMMUNITY)
Admission: AD | Admit: 2022-11-23 | Discharge: 2022-11-23 | Disposition: A | Discharge status: Home / Self Care | Payer: 59 | Attending: Obstetrics & Gynecology | Admitting: Obstetrics & Gynecology

## 2022-11-23 ENCOUNTER — Encounter (HOSPITAL_COMMUNITY): Payer: Self-pay | Admitting: Obstetrics & Gynecology

## 2022-11-23 ENCOUNTER — Encounter (HOSPITAL_COMMUNITY): Payer: Self-pay

## 2022-11-23 ENCOUNTER — Encounter: Payer: Self-pay | Admitting: Internal Medicine

## 2022-11-23 ENCOUNTER — Inpatient Hospital Stay (HOSPITAL_COMMUNITY): Payer: 59

## 2022-11-23 DIAGNOSIS — Z3A09 9 weeks gestation of pregnancy: Secondary | ICD-10-CM

## 2022-11-23 DIAGNOSIS — O26891 Other specified pregnancy related conditions, first trimester: Secondary | ICD-10-CM | POA: Insufficient documentation

## 2022-11-23 DIAGNOSIS — O0380 Unspecified complication following complete or unspecified spontaneous abortion: Secondary | ICD-10-CM

## 2022-11-23 DIAGNOSIS — R109 Unspecified abdominal pain: Secondary | ICD-10-CM | POA: Diagnosis not present

## 2022-11-23 DIAGNOSIS — O034 Incomplete spontaneous abortion without complication: Secondary | ICD-10-CM | POA: Diagnosis not present

## 2022-11-23 HISTORY — DX: Herpesviral infection, unspecified: B00.9

## 2022-11-23 HISTORY — DX: Anxiety disorder, unspecified: F41.9

## 2022-11-23 HISTORY — DX: Urinary tract infection, site not specified: N39.0

## 2022-11-23 LAB — URINALYSIS, ROUTINE W REFLEX MICROSCOPIC
Bilirubin Urine: NEGATIVE
Glucose, UA: NEGATIVE mg/dL
Hgb urine dipstick: NEGATIVE
Ketones, ur: NEGATIVE mg/dL
Leukocytes,Ua: NEGATIVE
Nitrite: NEGATIVE
Protein, ur: NEGATIVE mg/dL
Specific Gravity, Urine: 1.024 (ref 1.005–1.030)
pH: 5 (ref 5.0–8.0)

## 2022-11-23 LAB — POCT PREGNANCY, URINE: Preg Test, Ur: POSITIVE — AB

## 2022-11-23 LAB — CBC
HCT: 40.8 % (ref 36.0–46.0)
Hemoglobin: 13.5 g/dL (ref 12.0–15.0)
MCH: 27.1 pg (ref 26.0–34.0)
MCHC: 33.1 g/dL (ref 30.0–36.0)
MCV: 81.9 fL (ref 80.0–100.0)
Platelets: 234 10*3/uL (ref 150–400)
RBC: 4.98 MIL/uL (ref 3.87–5.11)
RDW: 14 % (ref 11.5–15.5)
WBC: 7.9 10*3/uL (ref 4.0–10.5)
nRBC: 0 % (ref 0.0–0.2)

## 2022-11-23 LAB — HCG, QUANTITATIVE, PREGNANCY: hCG, Beta Chain, Quant, S: 13765 m[IU]/mL — ABNORMAL HIGH (ref <5)

## 2022-11-23 MED ORDER — IBUPROFEN 800 MG PO TABS: 800.0000 mg | ORAL_TABLET | Freq: Three times a day (TID) | ORAL | 0 refills | Status: DC | PRN

## 2022-11-23 MED ORDER — TRAMADOL HCL 50 MG PO TABS: 50.0000 mg | ORAL_TABLET | Freq: Four times a day (QID) | ORAL | 0 refills | Status: DC | PRN

## 2022-11-23 NOTE — ED Notes (Signed)
Patient is being discharged from the Urgent Care and sent to the Emergency Department via POV . Per Garey Ham, PA-C, patient is in need of higher level of care due to recent abortion and abdominal pain. Patient is aware and verbalizes understanding of plan of care.  Vitals:   11/23/22 1338  BP: 134/83  Pulse: 71  Resp: 16  Temp: 99.1 F (37.3 C)  SpO2: 99%

## 2022-11-23 NOTE — Discharge Instructions (Addendum)
Please go to [facility] , since your pain is worse than when you had an abortion before, you need to have an ultrasound done today.

## 2022-11-23 NOTE — Telephone Encounter (Signed)
In a situation like this would it be best that I have her schedule and appointment and have another urine sample done?

## 2022-11-23 NOTE — MAU Provider Note (Signed)
Chief Complaint: Abdominal Pain and Vaginal Bleeding   None     SUBJECTIVE HPI: Suzanne Wolfe is a 27 y.o. GP0010 at [redacted]w[redacted]d by LMP who presents to maternity admissions reporting she had a surgical abortion on 11/21/22 and is now having 7/10 abdominal pain. She had a termination in the past and reports she did not have pain like this.  She is taking ibuprofen every 8 hours but the pain is still present and she is almost out of the medicine.  She is having scant bleeding only.       HPI  Past Medical History:  Diagnosis Date   Anxiety    thereapy, no meds   Herpes    History of Clostridium difficile infection 02/ 2018  resolve w/ antibiotic   Mild asthma    Patellar instability of right knee    UTI (urinary tract infection)    Past Surgical History:  Procedure Laterality Date   COLONOSCOPY  08-04-2016  dr stark   DILATION AND CURETTAGE OF UTERUS  11/21/2022   ESOPHAGOGASTRODUODENOSCOPY  06/20/2016   LAPAROSCOPIC APPENDECTOMY  07/16/2009   MEDIAL PATELLOFEMORAL LIGAMENT REPAIR Right 10/31/2016   Procedure: Right knee arthroscopic assisted medial patella femoral ligament reconstruction;  Surgeon: Yolonda Kida, MD;  Location: [facility];  Service: Orthopedics;  Laterality: Right;   PILONIDAL CYST EXCISION N/A 07/02/2014   Procedure: EXCISION OF PILONIDAL CYST WITH PRIMARY CLOSURE;  Surgeon: Judie Petit. Leonia Corona, MD;  Location: [facility];  Service: Pediatrics;  Laterality: N/A;   PILONIDAL CYST EXCISION N/A 10/22/2014   Procedure: EXCISION PILONIDAL CYST AND SINUS;  Surgeon: Leonia Corona, MD;  Location: [facility];  Service: Pediatrics;  Laterality: N/A;  Re-excision   WISDOM TOOTH EXTRACTION     Social History   Socioeconomic History   Marital status: Single    Spouse name: Not on file   Number of children: Not on file   Years of education: Not on file   Highest education level: Not on file  Occupational History   Not on  file  Tobacco Use   Smoking status: Never   Smokeless tobacco: Never   Tobacco comments:    Vaping 2018  Vaping Use   Vaping status: Former  Substance and Sexual Activity   Alcohol use: Not Currently   Drug use: No   Sexual activity: Not Currently    Birth control/protection: None  Other Topics Concern   Not on file  Social History Narrative   Not on file   Social Determinants of Health   Financial Resource Strain: Not on file  Food Insecurity: Not on file  Transportation Needs: Not on file  Physical Activity: Not on file  Stress: Not on file  Social Connections: Unknown (09/27/2021)   Received from [facility]   Social Network    Social Network: Not on file  Intimate Partner Violence: Unknown (08/19/2021)   Received from [facility], [facility]   HITS    Physically Hurt: Not on file    Insult or Talk Down To: Not on file    Threaten Physical Harm: Not on file    Scream or Curse: Not on file   No current facility-administered medications on file prior to encounter.   Current Outpatient Medications on File Prior to Encounter  Medication Sig Dispense Refill   valACYclovir (VALTREX) 1000 MG tablet Take 1 tablet (1,000 mg total) by mouth daily. 90 tablet 3   albuterol (PROVENTIL) (5 MG/ML)  0.5% nebulizer solution Take 0.5 mLs (2.5 mg total) by nebulization every 6 (six) hours as needed for wheezing or shortness of breath. 20 mL 12   albuterol (VENTOLIN HFA) 108 (90 Base) MCG/ACT inhaler Inhale 1-2 puffs into the lungs every 6 (six) hours as needed for wheezing or shortness of breath. 18 g 0   No Known Allergies  ROS:  Review of Systems  Constitutional:  Negative for chills, fatigue and fever.  Respiratory:  Negative for shortness of breath.   Cardiovascular:  Negative for chest pain.  Gastrointestinal:  Positive for abdominal pain.  Genitourinary:  Positive for pelvic pain. Negative for difficulty urinating, dysuria, flank pain, vaginal bleeding,  vaginal discharge and vaginal pain.  Neurological:  Negative for dizziness and headaches.  Psychiatric/Behavioral: Negative.       I have reviewed patient's Past Medical Hx, Surgical Hx, Family Hx, Social Hx, medications and allergies.   Physical Exam  No data found. BP 110/74, Pulse 76, Temp 98.8, Resp 17  Constitutional: Well-developed, well-nourished female in no acute distress.  Cardiovascular: normal rate Respiratory: normal effort GI: Abd soft, non-tender. Pos BS x 4 MS: Extremities nontender, no edema, normal ROM Neurologic: Alert and oriented x 4.  GU: Neg CVAT.  PELVIC EXAM: deferred, self swab collected by pt    LAB RESULTS      Results for orders placed or performed during the hospital encounter of 11/23/22 (from the past 48 hour(s))  Pregnancy, urine POC     Status: Abnormal   Collection Time: 11/23/22  2:43 PM  Result Value Ref Range   Preg Test, Ur POSITIVE (A) NEGATIVE    Comment:        THE SENSITIVITY OF THIS METHODOLOGY IS >24 mIU/mL   hCG, quantitative, pregnancy     Status: Abnormal   Collection Time: 11/23/22  2:56 PM  Result Value Ref Range   hCG, Beta Chain, Quant, S 13,765 (H) <5 mIU/mL    Comment:          GEST. AGE      CONC.  (mIU/mL)   <=1 WEEK        5 - 50     2 WEEKS       50 - 500     3 WEEKS       100 - 10,000     4 WEEKS     1,000 - 30,000     5 WEEKS     3,500 - 115,000   6-8 WEEKS     12,000 - 270,000    12 WEEKS     15,000 - 220,000        FEMALE AND NON-PREGNANT FEMALE:     LESS THAN 5 mIU/mL Performed at [facility], [address] [place], [place] [address]   CBC     Status: None   Collection Time: 11/23/22  2:56 PM  Result Value Ref Range   WBC 7.9 4.0 - 10.5 K/uL   RBC 4.98 3.87 - 5.11 MIL/uL   Hemoglobin 13.5 12.0 - 15.0 g/dL   HCT 01.0 27.2 - 53.6 %   MCV 81.9 80.0 - 100.0 fL   MCH 27.1 26.0 - 34.0 pg   MCHC 33.1 30.0 - 36.0 g/dL   RDW 64.4 03.4 - 74.2 %   Platelets 234 150 - 400 K/uL   nRBC 0.0 0.0  - 0.2 %    Comment: Performed at [facility], [address], [place], [place]  [address]  Urinalysis, Routine w reflex microscopic -Urine, Clean Catch     Status: None   Collection Time: 11/23/22  3:07 PM  Result Value Ref Range   Color, Urine YELLOW YELLOW   APPearance CLEAR CLEAR   Specific Gravity, Urine 1.024 1.005 - 1.030   pH 5.0 5.0 - 8.0   Glucose, UA NEGATIVE NEGATIVE mg/dL   Hgb urine dipstick NEGATIVE NEGATIVE   Bilirubin Urine NEGATIVE NEGATIVE   Ketones, ur NEGATIVE NEGATIVE mg/dL   Protein, ur NEGATIVE NEGATIVE mg/dL   Nitrite NEGATIVE NEGATIVE   Leukocytes,Ua NEGATIVE NEGATIVE    Comment: Performed at [facility], [address] [place], [place] [address]    IMAGING US OB LESS THAN 14 WEEKS WITH OB TRANSVAGINAL  Result Date: 11/23/2022 CLINICAL DATA:  Abdominal pain during pregnancy in first trimester. Status post therapeutic abortion 11/21/2022. Assess for complication. Quantitative beta hCG is pending. EXAM: OBSTETRIC <14 WK [place] AND TRANSVAGINAL OB US TECHNIQUE: Both transabdominal and transvaginal ultrasound examinations were performed for complete evaluation of the gestation as well as the maternal uterus, adnexal regions, and pelvic cul-de-sac. Transvaginal technique was performed to assess early pregnancy. COMPARISON:  Early OB ultrasound 06/08/2020 FINDINGS: Maternal uterus/adnexae: The endometrium is thickened, measuring up to 19 mm in AP dimension. It is heterogeneous and irregular. No significant hypervascularity is seen on color flow images, however it is difficult to exclude a retained products of conception. There is a hypoechoic oval heterogeneous region measured at 11 x 8 x 13 mm which could represent retained products of conception. Subchorionic hemorrhage:  None visualized. The right ovary measures 2.6 x 1.0 x 1.9 cm and the left ovary measures 2.6 x 1.2 x 1.9 cm. A 1.2 x 0.8 x 0.7 cm cyst is seen within the left ovary. No follow-up imaging is  recommended. No free fluid. IMPRESSION: There is thickening and heterogeneity of the endometrium. A hypoechoic oval heterogeneous region within the endometrial canal is measured at 11 x 8 x 13 mm. Although there is no hypervascularity seen on color flow images of this region,it is difficult to exclude retained products of conception. Electronically Signed   By: Neita Garnet M.D.   On: 11/23/2022 16:15    MAU Management/MDM: Orders Placed This Encounter  Procedures   US OB LESS THAN 14 WEEKS WITH OB TRANSVAGINAL   hCG, quantitative, pregnancy   CBC   Urinalysis, Routine w reflex microscopic -Urine, Clean Catch   Pregnancy, urine POC   Discharge patient    Meds ordered this encounter  Medications   traMADol (ULTRAM) 50 MG tablet    Sig: Take 1-2 tablets (50-100 mg total) by mouth every 6 (six) hours as needed.    Dispense:  10 tablet    Refill:  0    Order Specific Question:   Supervising Provider    Answer:   Reva Bores [[id number]]   ibuprofen (ADVIL) 800 MG tablet    Sig: Take 1 tablet (800 mg total) by mouth every 8 (eight) hours as needed for moderate pain.    Dispense:  30 tablet    Refill:  0    Order Specific Question:   Supervising Provider    Answer:   Reva Bores [[id number]]    No acute abdomen on exam. Pt stable, Hgb 13.5.  [place] not conclusive but unlikely retained POCs. Consult Dr Charlotta Newton with assessment and findings.  Pain management and pt to follow up at [facility] in 2-3 weeks. She does not plan  to return to [facility] because of insurance coverage.  Return to MAU as needed for emergencies.  .    ASSESSMENT 1. Abdominal pain during pregnancy in first trimester   2. Post-abortion complication   3. [redacted] weeks gestation of pregnancy     PLAN Discharge home Allergies as of 11/23/2022   No Known Allergies      Medication List     TAKE these medications    albuterol 108 (90 Base) MCG/ACT inhaler Commonly known as: VENTOLIN HFA Inhale 1-2 puffs into the lungs every 6  (six) hours as needed for wheezing or shortness of breath.   albuterol (5 MG/ML) 0.5% nebulizer solution Commonly known as: PROVENTIL Take 0.5 mLs (2.5 mg total) by nebulization every 6 (six) hours as needed for wheezing or shortness of breath.   ibuprofen 800 MG tablet Commonly known as: ADVIL Take 1 tablet (800 mg total) by mouth every 8 (eight) hours as needed for moderate pain.   traMADol 50 MG tablet Commonly known as: ULTRAM Take 1-2 tablets (50-100 mg total) by mouth every 6 (six) hours as needed.   valACYclovir 1000 MG tablet Commonly known as: Valtrex Take 1 tablet (1,000 mg total) by mouth daily.        Follow-up Information     [facility] Follow up in 2 week(s).   Specialty: Obstetrics and Gynecology Why: The clinic will call you with appointment. Contact information: [address] [place] [address] [phone]        [facility] Follow up.   Specialty: Obstetrics and Gynecology Why: As needed for emergencies Contact information: [address] [id number] [address] [place] [address] [phone]                Sharen Counter Certified Nurse-Midwife 11/24/2022  6:58 PM

## 2022-11-23 NOTE — ED Triage Notes (Signed)
Pt states she has a surgical abortion on Tuesday she was 9 weeks. She states she called the facility and they advised her she was having normal cramping which should comes in waves but she already made the appt and wanted to be checked anyway. She states she was taking IBU dual-max yesterday and it wasn't helping so she changed to IBU 800mg  and her last dose was at 9AM. She states she is still in a lot of pain but the IBU 800mg  helped she only has one left and wasn't sure what to do for the pain after they are gone.   She states that she has had a abortion before but it was at 6 weeks and it didn't feel like this at all.

## 2022-11-23 NOTE — MAU Note (Signed)
Suzanne Wolfe is a 27 y.o. at Unknown here in MAU reporting: surgical abortion at [facility] on 7/9.  The day of she was in pain, yesterday was having a lot of abd pain, even with taking Ibuprofen 800mg  as instructed. "Waves of pain, like contractions 10/10, even after that passed, there would still be pain that was like a 7/10". Was having a lot of gas yesterday, felt like it was going to stomach and cause bloating 10/10 pain, as she wasn't able to pass it.  Called provider that did procedure, was told the pain sounded normal, pt went to UC is almost out of Ibuprofen, was instructed to come here to  make sure nothing was left behind. Light bleeding Last dose of Ibuprofen was at 0900 LMP: 5/4 Onset of complaint: 7/9 Pain score: 4, increases with movement  6/10 Vitals:   11/23/22 1448  BP: 110/74  Pulse: 76  Resp: 17  Temp: 98.8 F (37.1 C)  SpO2: 100%      Lab orders placed from triage:  UPT +

## 2022-12-13 ENCOUNTER — Telehealth: Payer: 59 | Admitting: Nurse Practitioner

## 2022-12-13 DIAGNOSIS — B379 Candidiasis, unspecified: Secondary | ICD-10-CM

## 2022-12-13 DIAGNOSIS — T3695XA Adverse effect of unspecified systemic antibiotic, initial encounter: Secondary | ICD-10-CM | POA: Diagnosis not present

## 2022-12-13 MED ORDER — FLUCONAZOLE 150 MG PO TABS
150.0000 mg | ORAL_TABLET | Freq: Once | ORAL | 0 refills | Status: AC
Start: 2022-12-13 — End: 2022-12-13

## 2022-12-13 NOTE — Progress Notes (Signed)

## 2022-12-26 ENCOUNTER — Ambulatory Visit (HOSPITAL_COMMUNITY): Payer: 59

## 2023-01-15 ENCOUNTER — Encounter (HOSPITAL_COMMUNITY): Payer: Self-pay

## 2023-01-15 ENCOUNTER — Ambulatory Visit (HOSPITAL_COMMUNITY)
Admission: RE | Admit: 2023-01-15 | Discharge: 2023-01-15 | Disposition: A | Discharge status: Home / Self Care | Payer: 59 | Source: Ambulatory Visit | Attending: Internal Medicine | Admitting: Internal Medicine

## 2023-01-15 VITALS — BP 113/79 | HR 85 | Temp 97.6°F | Resp 16 | Ht 61.0 in | Wt 170.0 lb

## 2023-01-15 DIAGNOSIS — Z711 Person with feared health complaint in whom no diagnosis is made: Secondary | ICD-10-CM | POA: Diagnosis present

## 2023-01-15 DIAGNOSIS — N898 Other specified noninflammatory disorders of vagina: Secondary | ICD-10-CM | POA: Diagnosis present

## 2023-01-15 DIAGNOSIS — Z113 Encounter for screening for infections with a predominantly sexual mode of transmission: Secondary | ICD-10-CM | POA: Diagnosis not present

## 2023-01-15 LAB — POCT URINALYSIS DIP (MANUAL ENTRY)
Bilirubin, UA: NEGATIVE
Glucose, UA: NEGATIVE mg/dL
Ketones, POC UA: NEGATIVE mg/dL
Leukocytes, UA: NEGATIVE
Nitrite, UA: NEGATIVE
Protein Ur, POC: NEGATIVE mg/dL
Spec Grav, UA: 1.025 (ref 1.010–1.025)
Urobilinogen, UA: 0.2 U/dL
pH, UA: 6 (ref 5.0–8.0)

## 2023-01-15 LAB — POCT URINE PREGNANCY: Preg Test, Ur: NEGATIVE

## 2023-01-15 NOTE — ED Triage Notes (Signed)
Patient here today with c/o vaginal discharge X 1 week. Patient started her mensis today. She is having some abd cramping.   She would like to be tested for all STDs.

## 2023-01-15 NOTE — ED Provider Notes (Signed)
[facility]    CSN: [id number] Arrival date & time: 01/15/23  1100      History   Chief Complaint Chief Complaint  Patient presents with   Vaginal Discharge    Phenix discharge in between periods. I wanted to get STD testing - Entered by patient    HPI Suzanne Wolfe is a 27 y.o. female.   27 year old female with 1 week history of brownish discharge from her vagina.  She denies any itching or burning.  She denies any pain, hematuria, frequency, pain with urination.  She has some mild lower abdominal pain but has just started her period.  She denies any fevers or chills.  She has been with the same partner for the past 9 months.  She did recently have unprotected sex.  She would like to be tested for STDs.     Vaginal Discharge Associated symptoms: abdominal pain (Mild, from period)   Associated symptoms: no dysuria, no fever and no vomiting     Past Medical History:  Diagnosis Date   Anxiety    thereapy, no meds   Herpes    History of Clostridium difficile infection 02/ 2018  resolve w/ antibiotic   Mild asthma    Patellar instability of right knee    UTI (urinary tract infection)     Patient Active Problem List   Diagnosis Date Noted   Pregnancy 11/09/2022   ADD (attention deficit disorder) 11/09/2022   HSV (herpes simplex virus) infection 11/09/2022   Moderate persistent asthma without complication 07/19/2017   Migraine without aura and without status migrainosus, not intractable 12/04/2013    Past Surgical History:  Procedure Laterality Date   COLONOSCOPY  08-04-2016  dr stark   DILATION AND CURETTAGE OF UTERUS  11/21/2022   ESOPHAGOGASTRODUODENOSCOPY  06/20/2016   LAPAROSCOPIC APPENDECTOMY  07/16/2009   MEDIAL PATELLOFEMORAL LIGAMENT REPAIR Right 10/31/2016   Procedure: Right knee arthroscopic assisted medial patella femoral ligament reconstruction;  Surgeon: Yolonda Kida, MD;  Location: [facility];  Service: Orthopedics;   Laterality: Right;   PILONIDAL CYST EXCISION N/A 07/02/2014   Procedure: EXCISION OF PILONIDAL CYST WITH PRIMARY CLOSURE;  Surgeon: Judie Petit. Leonia Corona, MD;  Location: [facility];  Service: Pediatrics;  Laterality: N/A;   PILONIDAL CYST EXCISION N/A 10/22/2014   Procedure: EXCISION PILONIDAL CYST AND SINUS;  Surgeon: Leonia Corona, MD;  Location: [facility];  Service: Pediatrics;  Laterality: N/A;  Re-excision   WISDOM TOOTH EXTRACTION      OB History     Gravida  2   Para      Term      Preterm      AB  1   Living         SAB      IAB  1   Ectopic      Multiple      Live Births           Obstetric Comments  Surgical AB both 24 and 26          Home Medications    Prior to Admission medications   Medication Sig Start Date End Date Taking? Authorizing Provider  norgestimate-ethinyl estradiol (ORTHO-CYCLEN) 0.25-35 MG-MCG tablet Take 1 tablet by mouth daily.   Yes [provider]  albuterol (PROVENTIL) (5 MG/ML) 0.5% nebulizer solution Take 0.5 mLs (2.5 mg total) by nebulization every 6 (six) hours as needed for wheezing or shortness of breath. 09/22/22   Garrison, [place]  N, FNP  albuterol (VENTOLIN HFA) 108 (90 Base) MCG/ACT inhaler Inhale 1-2 puffs into the lungs every 6 (six) hours as needed for wheezing or shortness of breath. 09/22/22   Garrison, [place] N, FNP  ibuprofen (ADVIL) 800 MG tablet Take 1 tablet (800 mg total) by mouth every 8 (eight) hours as needed for moderate pain. 11/23/22   Leftwich-Kirby, Wilmer Floor, CNM  traMADol (ULTRAM) 50 MG tablet Take 1-2 tablets (50-100 mg total) by mouth every 6 (six) hours as needed. 11/23/22   Leftwich-Kirby, Wilmer Floor, CNM  valACYclovir (VALTREX) 1000 MG tablet Take 1 tablet (1,000 mg total) by mouth daily. 11/09/22   Myrlene Broker, MD    Family History Family History  Problem Relation Age of Onset   Depression Mother    Anxiety disorder Mother    Bipolar disorder Mother     Asthma Father    Diabetes Maternal Grandmother    Hypertension Maternal Grandmother    Heart disease Maternal Grandmother        hx. open heart surgery   Colon cancer Neg Hx    Esophageal cancer Neg Hx     Social History Social History   Tobacco Use   Smoking status: Never   Smokeless tobacco: Never   Tobacco comments:    Vaping 2018  Vaping Use   Vaping status: Former  Substance Use Topics   Alcohol use: Not Currently    Comment: socially   Drug use: No     Allergies   Patient has no known allergies.   Review of Systems Review of Systems  Constitutional:  Negative for chills and fever.  HENT:  Negative for ear pain and sore throat.   Eyes:  Negative for pain and visual disturbance.  Respiratory:  Negative for cough and shortness of breath.   Cardiovascular:  Negative for chest pain and palpitations.  Gastrointestinal:  Positive for abdominal pain (Mild, from period). Negative for vomiting.  Genitourinary:  Positive for vaginal discharge. Negative for dysuria and hematuria.  Musculoskeletal:  Negative for arthralgias and back pain.  Skin:  Negative for color change and rash.  Neurological:  Negative for seizures and syncope.  All other systems reviewed and are negative.    Physical Exam Triage Vital Signs ED Triage Vitals [01/15/23 1114]  Encounter Vitals Group     BP 113/79     Systolic BP Percentile      Diastolic BP Percentile      Pulse Rate 85     Resp 16     Temp 97.6 F (36.4 C)     Temp Source Temporal     SpO2 97 %     Weight 170 lb (77.1 kg)     Height 5\' 1"  (1.549 m)     Head Circumference      Peak Flow      Pain Score 0     Pain Loc      Pain Education      Exclude from Growth Chart    No data found.  Updated Vital Signs BP 113/79 (BP Location: Right Arm)   Pulse 85   Temp 97.6 F (36.4 C) (Temporal)   Resp 16   Ht 5\' 1"  (1.549 m)   Wt 170 lb (77.1 kg)   LMP 12/17/2022 (Exact Date) Comment: abortion on 11/21/2022  SpO2 97%    Breastfeeding No   BMI 32.12 kg/m   Visual Acuity Right Eye Distance:   Left Eye Distance:   Bilateral Distance:  Right Eye Near:   Left Eye Near:    Bilateral Near:     Physical Exam Vitals and nursing note reviewed.  Constitutional:      General: She is not in acute distress.    Appearance: She is well-developed.  HENT:     Head: Normocephalic and atraumatic.  Eyes:     Conjunctiva/sclera: Conjunctivae normal.  Cardiovascular:     Rate and Rhythm: Normal rate and regular rhythm.     Heart sounds: No murmur heard. Pulmonary:     Effort: Pulmonary effort is normal. No respiratory distress.     Breath sounds: Normal breath sounds.  Abdominal:     Palpations: Abdomen is soft.     Tenderness: There is no abdominal tenderness.  Musculoskeletal:        General: No swelling.     Cervical back: Neck supple.  Skin:    General: Skin is warm and dry.     Capillary Refill: Capillary refill takes less than 2 seconds.  Neurological:     Mental Status: She is alert.  Psychiatric:        Mood and Affect: Mood normal.      UC Treatments / Results  Labs (all labs ordered are listed, but only abnormal results are displayed) Labs Reviewed  POCT URINE PREGNANCY  POCT URINALYSIS DIP (MANUAL ENTRY)    EKG   Radiology No results found.  Procedures Procedures (including critical care time)  Medications Ordered in UC Medications - No data to display  Initial Impression / Assessment and Plan / UC Course  I have reviewed the triage vital signs and the nursing notes.  Pertinent labs & imaging results that were available during my care of the patient were reviewed by me and considered in my medical decision making (see chart for details).     Vaginal discharge: Pregnancy test is negative, urine dipstick is negative for infection but has large red blood cells consistent with her starting her period. Vaginal swab is pending and advised patient if the results come back  positive we will contact her, otherwise results will be available on MyChart. Follow up if symptoms worsen or fail to improve.  Final Clinical Impressions(s) / UC Diagnoses   Final diagnoses:  None   Discharge Instructions   None    ED Prescriptions   None    PDMP not reviewed this encounter.   Landis Martins, [place] 01/15/23 1203

## 2023-01-15 NOTE — Discharge Instructions (Signed)
Will call if any results are positive and prescribe treatment if needed. Return to urgent care if symptoms worsen or fail to improve. Results will be on your MyChart account.

## 2023-01-18 LAB — CERVICOVAGINAL ANCILLARY ONLY
Bacterial Vaginitis (gardnerella): NEGATIVE
Chlamydia: NEGATIVE
Comment: NEGATIVE
Comment: NEGATIVE
Comment: NEGATIVE
Comment: NORMAL
Neisseria Gonorrhea: NEGATIVE
Trichomonas: NEGATIVE

## 2023-01-21 ENCOUNTER — Telehealth: Payer: 59 | Admitting: Physician Assistant

## 2023-01-21 DIAGNOSIS — B3731 Acute candidiasis of vulva and vagina: Secondary | ICD-10-CM | POA: Diagnosis not present

## 2023-01-21 MED ORDER — FLUCONAZOLE 150 MG PO TABS: 150.0000 mg | ORAL_TABLET | Freq: Every day | ORAL | 0 refills | Status: DC

## 2023-01-21 NOTE — Progress Notes (Signed)

## 2023-01-29 ENCOUNTER — Encounter: Payer: Self-pay | Admitting: Nurse Practitioner

## 2023-01-29 ENCOUNTER — Ambulatory Visit (INDEPENDENT_AMBULATORY_CARE_PROVIDER_SITE_OTHER): Payer: 59 | Admitting: Nurse Practitioner

## 2023-01-29 VITALS — BP 116/77 | HR 75 | Temp 98.6°F | Ht 61.0 in | Wt 172.0 lb

## 2023-01-29 DIAGNOSIS — J4540 Moderate persistent asthma, uncomplicated: Secondary | ICD-10-CM

## 2023-01-29 DIAGNOSIS — E669 Obesity, unspecified: Secondary | ICD-10-CM

## 2023-01-29 DIAGNOSIS — Z6832 Body mass index (BMI) 32.0-32.9, adult: Secondary | ICD-10-CM

## 2023-01-29 NOTE — Progress Notes (Signed)
Office: [phone]  /  Fax: [phone]   Initial Visit  Suzanne Wolfe was seen in clinic today to evaluate for obesity. She is interested in losing weight to improve overall health and reduce the risk of weight related complications. She presents today to review program treatment options, initial physical assessment, and evaluation.     She was referred by: Friend or Family  When asked what else they would like to accomplish? She states: Improve existing medical conditions, Improve quality of life, and Lose a target amount of weight : unsure  Weight history:  She started gaining weight when she was 27 yo and her weight has increased over the years.  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   When asked how has your weight affected you? She states: Contributed to orthopedic problems or mobility issues  Some associated conditions: asthma, migraines, ADD  Contributing factors: Stress  Weight promoting medications identified: None  Current nutrition plan: None  Current level of physical activity: None  Current or previous pharmacotherapy: None  Response to medication: Never tried medications   Past medical history includes:   Past Medical History:  Diagnosis Date   Anxiety    thereapy, no meds   Herpes    History of Clostridium difficile infection 02/ 2018  resolve w/ antibiotic   Mild asthma    Patellar instability of right knee    UTI (urinary tract infection)      Objective:   BP 116/77   Pulse 75   Temp 98.6 F (37 C)   Ht 5\' 1"  (1.549 m)   Wt 172 lb (78 kg)   LMP 01/15/2023 (Approximate)   SpO2 100%   BMI 32.50 kg/m  She was weighed on the bioimpedance scale: Body mass index is 32.5 kg/m.  Peak Weight:172 lbs , Body Fat%:39%, Visceral Fat Rating:6, Weight trend over the last 12 months: Increasing  General:  Alert, oriented and cooperative. Patient is in no acute distress.  Respiratory: Normal respiratory  effort, no problems with respiration noted   Gait: able to ambulate independently  Mental Status: Normal mood and affect. Normal behavior. Normal judgment and thought content.   DIAGNOSTIC DATA REVIEWED:  BMET    Component Value Date/Time   NA 135 05/04/2017 1703   K 3.9 05/04/2017 1703   CL 105 05/04/2017 1703   CO2 21 (L) 05/04/2017 1703   GLUCOSE 75 05/04/2017 1703   BUN 10 05/04/2017 1703   CREATININE 0.78 05/04/2017 1703   CALCIUM 9.0 05/04/2017 1703   GFRNONAA >60 05/04/2017 1703   GFRAA >60 05/04/2017 1703   No results found for: "HGBA1C" No results found for: "INSULIN" CBC    Component Value Date/Time   WBC 7.9 11/23/2022 1456   RBC 4.98 11/23/2022 1456   HGB 13.5 11/23/2022 1456   HCT 40.8 11/23/2022 1456   PLT 234 11/23/2022 1456   MCV 81.9 11/23/2022 1456   MCH 27.1 11/23/2022 1456   MCHC 33.1 11/23/2022 1456   RDW 14.0 11/23/2022 1456   Iron/TIBC/Ferritin/ %Sat No results found for: "IRON", "TIBC", "FERRITIN", "IRONPCTSAT" Lipid Panel  No results found for: "CHOL", "TRIG", "HDL", "CHOLHDL", "VLDL", "LDLCALC", "LDLDIRECT" Hepatic Function Panel     Component Value Date/Time   PROT 7.0 05/04/2017 1703   ALBUMIN 3.7 05/04/2017 1703   AST 26 05/04/2017 1703   ALT 24 05/04/2017 1703   ALKPHOS 48 05/04/2017 1703   BILITOT 0.5 05/04/2017 1703   No  results found for: "TSH"   Assessment and Plan:   Moderate persistent asthma without complication Continue to follow up with PCP.  Consider referral to pulmonary.    Generalized obesity  BMI 32.0-32.9,adult        Obesity Treatment / Action Plan:  Patient will work on garnering support from family and friends to begin weight loss journey. Will work on eliminating or reducing the presence of highly palatable, calorie dense foods in the home. Will complete provided nutritional and psychosocial assessment questionnaire before the next appointment. Will be scheduled for indirect calorimetry to determine  resting energy expenditure in a fasting state.  This will allow [place] to create a reduced calorie, high-protein meal plan to promote loss of fat mass while preserving muscle mass. Counseled on the health benefits of losing 5%-15% of total body weight. Was counseled on nutritional approaches to weight loss and benefits of reducing processed foods and consuming plant-based foods and high quality protein as part of nutritional weight management. Was counseled on pharmacotherapy and role as an adjunct in weight management.   Obesity Education Performed Today:  She was weighed on the bioimpedance scale and results were discussed and documented in the synopsis.  We discussed obesity as a disease and the importance of a more detailed evaluation of all the factors contributing to the disease.  We discussed the importance of long term lifestyle changes which include nutrition, exercise and behavioral modifications as well as the importance of customizing this to her specific health and social needs.  We discussed the benefits of reaching a healthier weight to alleviate the symptoms of existing conditions and reduce the risks of the biomechanical, metabolic and psychological effects of obesity.  Tanee Malinsky appears to be in the action stage of change and states they are ready to start intensive lifestyle modifications and behavioral modifications.  30 minutes was spent today on this visit including the above counseling, pre-visit chart review, and post-visit documentation.  Reviewed by clinician on day of visit: allergies, medications, problem list, medical history, surgical history, family history, social history, and previous encounter notes pertinent to obesity diagnosis.    Theodis Sato Arihaan Bellucci FNP-C

## 2023-01-30 ENCOUNTER — Ambulatory Visit: Payer: 59 | Admitting: Internal Medicine

## 2023-04-05 ENCOUNTER — Telehealth: Payer: 59 | Admitting: Physician Assistant

## 2023-04-05 DIAGNOSIS — B3731 Acute candidiasis of vulva and vagina: Secondary | ICD-10-CM | POA: Diagnosis not present

## 2023-04-05 MED ORDER — FLUCONAZOLE 150 MG PO TABS
150.0000 mg | ORAL_TABLET | Freq: Every day | ORAL | 0 refills | Status: DC
Start: 2023-04-05 — End: 2023-07-09

## 2023-04-05 NOTE — Progress Notes (Signed)
I have spent 5 minutes in review of e-visit questionnaire, review and updating patient chart, medical decision making and response to patient.   Mia Milan Cody Jacklynn Dehaas, PA-C    

## 2023-04-05 NOTE — Progress Notes (Signed)

## 2023-04-23 ENCOUNTER — Encounter (HOSPITAL_COMMUNITY): Payer: Self-pay

## 2023-04-23 ENCOUNTER — Ambulatory Visit (HOSPITAL_COMMUNITY)
Admission: RE | Admit: 2023-04-23 | Discharge: 2023-04-23 | Disposition: A | Discharge status: Home / Self Care | Payer: 59 | Source: Ambulatory Visit | Attending: Internal Medicine | Admitting: Internal Medicine

## 2023-04-23 VITALS — BP 130/92 | HR 79 | Temp 98.6°F | Resp 18 | Ht 61.0 in | Wt 170.0 lb

## 2023-04-23 DIAGNOSIS — N898 Other specified noninflammatory disorders of vagina: Secondary | ICD-10-CM

## 2023-04-23 DIAGNOSIS — B3731 Acute candidiasis of vulva and vagina: Secondary | ICD-10-CM | POA: Insufficient documentation

## 2023-04-23 DIAGNOSIS — Z113 Encounter for screening for infections with a predominantly sexual mode of transmission: Secondary | ICD-10-CM | POA: Diagnosis not present

## 2023-04-23 LAB — POCT URINALYSIS DIP (MANUAL ENTRY)
Bilirubin, UA: NEGATIVE
Glucose, UA: NEGATIVE mg/dL
Nitrite, UA: NEGATIVE
Protein Ur, POC: NEGATIVE mg/dL
Spec Grav, UA: >=1.03 — AB (ref 1.010–1.025)
Urobilinogen, UA: 0.2 U/dL
pH, UA: 5.5 (ref 5.0–8.0)

## 2023-04-23 LAB — POCT URINE PREGNANCY: Preg Test, Ur: NEGATIVE

## 2023-04-23 NOTE — ED Provider Notes (Signed)
[facility]    CSN: [id number] Arrival date & time: 04/23/23  1437      History   Chief Complaint Chief Complaint  Patient presents with   Vaginal Discharge    Entered by patient    HPI Suzanne Wolfe is a 27 y.o. female.   Patient presents to clinic complaining of changes to vaginal discharge over the past few days.  She noticed at the end of urination she had suprapubic pressure today.  Reports a strange odor to her vaginal discharge, no vaginal itching.  Discharge is increased, white and chunky.  She is not having any dysuria, urinary odor, flank pain, nausea, vomiting or abdominal pain.  She did recently have unprotected intercourse 2 weeks ago.  Reports her menstrual cycle should be starting soon.  The history is provided by the patient and medical records.  Vaginal Discharge   Past Medical History:  Diagnosis Date   Anxiety    thereapy, no meds   Herpes    History of Clostridium difficile infection 02/ 2018  resolve w/ antibiotic   Mild asthma    Patellar instability of right knee    UTI (urinary tract infection)     Patient Active Problem List   Diagnosis Date Noted   Pregnancy 11/09/2022   ADD (attention deficit disorder) 11/09/2022   HSV (herpes simplex virus) infection 11/09/2022   Moderate persistent asthma without complication 07/19/2017   Migraine without aura and without status migrainosus, not intractable 12/04/2013    Past Surgical History:  Procedure Laterality Date   COLONOSCOPY  08-04-2016  dr stark   DILATION AND CURETTAGE OF UTERUS  11/21/2022   ESOPHAGOGASTRODUODENOSCOPY  06/20/2016   LAPAROSCOPIC APPENDECTOMY  07/16/2009   MEDIAL PATELLOFEMORAL LIGAMENT REPAIR Right 10/31/2016   Procedure: Right knee arthroscopic assisted medial patella femoral ligament reconstruction;  Surgeon: Yolonda Kida, MD;  Location: [facility];  Service: Orthopedics;  Laterality: Right;   PILONIDAL CYST EXCISION N/A 07/02/2014    Procedure: EXCISION OF PILONIDAL CYST WITH PRIMARY CLOSURE;  Surgeon: Judie Petit. Leonia Corona, MD;  Location: [facility];  Service: Pediatrics;  Laterality: N/A;   PILONIDAL CYST EXCISION N/A 10/22/2014   Procedure: EXCISION PILONIDAL CYST AND SINUS;  Surgeon: Leonia Corona, MD;  Location: [facility];  Service: Pediatrics;  Laterality: N/A;  Re-excision   WISDOM TOOTH EXTRACTION      OB History     Gravida  2   Para      Term      Preterm      AB  1   Living         SAB      IAB  1   Ectopic      Multiple      Live Births           Obstetric Comments  Surgical AB both 24 and 26          Home Medications    Prior to Admission medications   Medication Sig Start Date End Date Taking? Authorizing Provider  albuterol (PROVENTIL) (5 MG/ML) 0.5% nebulizer solution Take 0.5 mLs (2.5 mg total) by nebulization every 6 (six) hours as needed for wheezing or shortness of breath. 09/22/22   Kasumi Ditullio, [place] N, FNP  albuterol (VENTOLIN HFA) 108 (90 Base) MCG/ACT inhaler Inhale 1-2 puffs into the lungs every 6 (six) hours as needed for wheezing or shortness of breath. 09/22/22   Decklan Mau, [place] N, FNP  fluconazole (DIFLUCAN) 150  MG tablet Take 1 tablet (150 mg total) by mouth daily. 04/05/23   Waldon Merl, PA-C  ibuprofen (ADVIL) 800 MG tablet Take 1 tablet (800 mg total) by mouth every 8 (eight) hours as needed for moderate pain. 11/23/22   Leftwich-Kirby, Wilmer Floor, CNM  norgestimate-ethinyl estradiol (ORTHO-CYCLEN) 0.25-35 MG-MCG tablet Take 1 tablet by mouth daily.    [provider]  valACYclovir (VALTREX) 1000 MG tablet Take 1 tablet (1,000 mg total) by mouth daily. 11/09/22   Myrlene Broker, MD    Family History Family History  Problem Relation Age of Onset   Depression Mother    Anxiety disorder Mother    Bipolar disorder Mother    Asthma Father    Diabetes Maternal Grandmother    Hypertension Maternal Grandmother     Heart disease Maternal Grandmother        hx. open heart surgery   Colon cancer Neg Hx    Esophageal cancer Neg Hx     Social History Social History   Tobacco Use   Smoking status: Never   Smokeless tobacco: Never   Tobacco comments:    Vaping 2018  Vaping Use   Vaping status: Former  Substance Use Topics   Alcohol use: Not Currently    Comment: socially   Drug use: No     Allergies   Patient has no known allergies.   Review of Systems Review of Systems  Per HPI   Physical Exam Triage Vital Signs ED Triage Vitals  Encounter Vitals Group     BP 04/23/23 1510 (!) 130/92     Systolic BP Percentile --      Diastolic BP Percentile --      Pulse Rate 04/23/23 1510 79     Resp 04/23/23 1510 18     Temp 04/23/23 1510 98.6 F (37 C)     Temp Source 04/23/23 1510 Oral     SpO2 04/23/23 1510 98 %     Weight 04/23/23 1508 170 lb (77.1 kg)     Height 04/23/23 1508 5\' 1"  (1.549 m)     Head Circumference --      Peak Flow --      Pain Score 04/23/23 1507 0     Pain Loc --      Pain Education --      Exclude from Growth Chart --    No data found.  Updated Vital Signs BP (!) 130/92 (BP Location: Right Arm)   Pulse 79   Temp 98.6 F (37 C) (Oral)   Resp 18   Ht 5\' 1"  (1.549 m)   Wt 170 lb (77.1 kg)   LMP 03/24/2023 (Exact Date) Comment: abortion on 11/21/2022  SpO2 98%   BMI 32.12 kg/m   Visual Acuity Right Eye Distance:   Left Eye Distance:   Bilateral Distance:    Right Eye Near:   Left Eye Near:    Bilateral Near:     Physical Exam Vitals and nursing note reviewed.  Constitutional:      Appearance: Normal appearance.  HENT:     Head: Normocephalic and atraumatic.     Right Ear: External ear normal.     Left Ear: External ear normal.     Nose: Nose normal.     Mouth/Throat:     Mouth: Mucous membranes are moist.  Eyes:     Conjunctiva/sclera: Conjunctivae normal.  Cardiovascular:     Rate and Rhythm: Normal rate.  Pulmonary:  Effort:  Pulmonary effort is normal. No respiratory distress.  Abdominal:     Tenderness: There is no right CVA tenderness or left CVA tenderness.  Musculoskeletal:        General: Normal range of motion.  Skin:    General: Skin is warm and dry.  Neurological:     General: No focal deficit present.     Mental Status: She is alert and oriented to person, place, and time.  Psychiatric:        Mood and Affect: Mood normal.        Behavior: Behavior normal.      UC Treatments / Results  Labs (all labs ordered are listed, but only abnormal results are displayed) Labs Reviewed  POCT URINALYSIS DIP (MANUAL ENTRY) - Abnormal; Notable for the following components:      Result Value   Clarity, UA cloudy (*)    Ketones, POC UA small (15) (*)    Spec Grav, UA >=1.030 (*)    Blood, UA small (*)    Leukocytes, UA Small (1+) (*)    All other components within normal limits  URINE CULTURE  POCT URINE PREGNANCY  CERVICOVAGINAL ANCILLARY ONLY    EKG   Radiology No results found.  Procedures Procedures (including critical care time)  Medications Ordered in UC Medications - No data to display  Initial Impression / Assessment and Plan / UC Course  I have reviewed the triage vital signs and the nursing notes.  Pertinent labs & imaging results that were available during my care of the patient were reviewed by me and considered in my medical decision making (see chart for details).  Vitals and triage reviewed, patient is hemodynamically stable.  No current urinary symptoms.  Negative for CVA tenderness.  No dysuria.  UA shows small red blood cells and small leukocytes, will send for culture.  Menstrual cycle is starting soon.  Changes to vaginal discharge with no itching.  Will withhold any treatment until cytology results have been received.  Urine pregnancy is negative.  Offered HIV and syphilis screening, patient reports she had this in the last few months we will hold off for now.  Plan of  care, follow-up care and return precautions given, no questions at this time.     Final Clinical Impressions(s) / UC Diagnoses   Final diagnoses:  Vaginal discharge  Screening examination for sexually transmitted disease     Discharge Instructions      Your urine pregnancy test was negative.  We are sending her urine off for culture and we will contact you if any antibiotics are indicated for urinary tract infection.  Your vaginal swab will result over the next day or so, and our staff will contact if anything is abnormal.  Abstain from intercourse until all results have been received.    ED Prescriptions   None    PDMP not reviewed this encounter.   Loletha Bertini, [place] N, [place] 04/23/23 1538

## 2023-04-23 NOTE — ED Triage Notes (Addendum)
Pt presents with vaginal discharge x 2 days. Pt also states "abdominal pain when urinating yesterday". Pt denies taking medications for symptoms. Pt denies pain at this time. Pt states that about two weeks ago she did have unprotected sex, believes it is yeast however would like reassurance that it is not something else.

## 2023-04-23 NOTE — Discharge Instructions (Addendum)
Your urine pregnancy test was negative.  We are sending her urine off for culture and we will contact you if any antibiotics are indicated for urinary tract infection.  Your vaginal swab will result over the next day or so, and our staff will contact if anything is abnormal.  Abstain from intercourse until all results have been received.

## 2023-04-24 LAB — CERVICOVAGINAL ANCILLARY ONLY
Bacterial Vaginitis (gardnerella): NEGATIVE
Candida Glabrata: NEGATIVE
Candida Vaginitis: POSITIVE — AB
Chlamydia: NEGATIVE
Comment: NEGATIVE
Comment: NEGATIVE
Comment: NEGATIVE
Comment: NEGATIVE
Comment: NEGATIVE
Comment: NORMAL
Neisseria Gonorrhea: NEGATIVE
Trichomonas: NEGATIVE

## 2023-04-25 ENCOUNTER — Telehealth (HOSPITAL_COMMUNITY): Payer: Self-pay

## 2023-04-25 LAB — URINE CULTURE

## 2023-04-25 MED ORDER — FLUCONAZOLE 150 MG PO TABS: 150.0000 mg | ORAL_TABLET | Freq: Once | ORAL | 0 refills | Status: AC

## 2023-04-25 NOTE — Telephone Encounter (Signed)
 Per protocol, pt requires tx with Diflucan. Reviewed with patient, verified pharmacy, prescription sent.

## 2023-07-09 ENCOUNTER — Telehealth: Payer: 59 | Admitting: Physician Assistant

## 2023-07-09 DIAGNOSIS — B3731 Acute candidiasis of vulva and vagina: Secondary | ICD-10-CM

## 2023-07-09 MED ORDER — FLUCONAZOLE 150 MG PO TABS: 150.0000 mg | ORAL_TABLET | ORAL | 0 refills | Status: DC | PRN

## 2023-07-09 NOTE — Progress Notes (Signed)

## 2023-08-02 ENCOUNTER — Encounter: Payer: Self-pay | Admitting: Internal Medicine

## 2023-08-14 ENCOUNTER — Telehealth (INDEPENDENT_AMBULATORY_CARE_PROVIDER_SITE_OTHER): Admitting: Internal Medicine

## 2023-08-14 ENCOUNTER — Encounter: Payer: Self-pay | Admitting: Internal Medicine

## 2023-08-14 DIAGNOSIS — L709 Acne, unspecified: Secondary | ICD-10-CM | POA: Insufficient documentation

## 2023-08-14 DIAGNOSIS — L700 Acne vulgaris: Secondary | ICD-10-CM | POA: Diagnosis not present

## 2023-08-14 MED ORDER — CLINDAMYCIN PHOSPHATE 1 % EX GEL: Freq: Two times a day (BID) | CUTANEOUS | 11 refills | Status: DC

## 2023-08-14 MED ORDER — ALBUTEROL SULFATE HFA 108 (90 BASE) MCG/ACT IN AERS: 1.0000 | INHALATION_SPRAY | Freq: Four times a day (QID) | RESPIRATORY_TRACT | 3 refills | Status: DC | PRN

## 2023-08-14 MED ORDER — ALBUTEROL SULFATE (5 MG/ML) 0.5% IN NEBU: 2.5000 mg | INHALATION_SOLUTION | Freq: Four times a day (QID) | RESPIRATORY_TRACT | 12 refills | Status: DC | PRN

## 2023-08-14 NOTE — Progress Notes (Signed)
 Virtual Visit via Video Note  I connected with Suzanne Wolfe on 08/14/23 at  3:20 PM EDT by a video enabled telemedicine application and verified that I am speaking with the correct person using two identifiers.  The patient and the provider were at separate locations throughout the entire encounter. Patient location: home, Provider location: work   I discussed the limitations of evaluation and management by telemedicine and the availability of in person appointments. The patient expressed understanding and agreed to proceed. The patient and the provider were the only parties present for the visit unless noted in HPI below.  History of Present Illness: The patient is a 28 y.o. female with visit for break outs acne more than lately. Started birth control recently. Happening for a year. Using differin gel and this helped short term only for outbreak acne spot treatment for an outbreak.   Observations/Objective: Appearance: normal, breathing appears normal, casual grooming, mental status is A and O times 3  Assessment and Plan: See problem oriented charting  Follow Up Instructions: Rx clindamycin 1% to use BID for acne and referral to dermatology.   I discussed the assessment and treatment plan with the patient. The patient was provided an opportunity to ask questions and all were answered. The patient agreed with the plan and demonstrated an understanding of the instructions.   The patient was advised to call back or seek an in-person evaluation if the symptoms worsen or if the condition fails to improve as anticipated.  Myrlene Broker, MD

## 2023-08-14 NOTE — Assessment & Plan Note (Signed)
 Rx clindamycin 1% to use BID and referral to dermatology. She is looking for more definitive solution this is going on more than 1 year.

## 2023-09-07 ENCOUNTER — Ambulatory Visit (HOSPITAL_COMMUNITY)

## 2023-09-13 ENCOUNTER — Encounter (HOSPITAL_COMMUNITY): Payer: Self-pay

## 2023-09-13 ENCOUNTER — Ambulatory Visit (HOSPITAL_COMMUNITY)
Admission: RE | Admit: 2023-09-13 | Discharge: 2023-09-13 | Disposition: A | Discharge status: Home / Self Care | Source: Ambulatory Visit | Attending: Emergency Medicine | Admitting: Emergency Medicine

## 2023-09-13 VITALS — BP 110/75 | HR 71 | Temp 98.5°F | Resp 16

## 2023-09-13 DIAGNOSIS — Z113 Encounter for screening for infections with a predominantly sexual mode of transmission: Secondary | ICD-10-CM | POA: Insufficient documentation

## 2023-09-13 DIAGNOSIS — N898 Other specified noninflammatory disorders of vagina: Secondary | ICD-10-CM | POA: Diagnosis present

## 2023-09-13 LAB — HIV ANTIBODY (ROUTINE TESTING W REFLEX): HIV Screen 4th Generation wRfx: NONREACTIVE

## 2023-09-13 NOTE — ED Triage Notes (Signed)
 Pt states white vaginal discharge for the past 3 days.  States she would like to be tested for STD's.

## 2023-09-13 NOTE — ED Provider Notes (Signed)
 [facility]    CSN: [id number] Arrival date & time: 09/13/23  1525      History   Chief Complaint Chief Complaint  Patient presents with   Vaginal Discharge    Entered by patient    HPI Suzanne Wolfe is a 28 y.o. female.   Patient presents with increased white vaginal discharge x 3 days.  Patient reports recent unprotected sexual intercourse and is requesting STD testing.  Denies dysuria, hematuria, urinary urgency/frequency.  Patient states that she took a Plan B the day after she had sexual intercourse and has been having some intermittent mild abdominal cramping since then.  Denies any heavy vaginal bleeding, severe abdominal pain, back pain, or fever.  LMP 08/23/2023  The history is provided by the patient and medical records.  Vaginal Discharge   Past Medical History:  Diagnosis Date   Anxiety    thereapy, no meds   Herpes    History of Clostridium difficile infection 02/ 2018  resolve w/ antibiotic   Mild asthma    Patellar instability of right knee    UTI (urinary tract infection)     Patient Active Problem List   Diagnosis Date Noted   Acne 08/14/2023   ADD (attention deficit disorder) 11/09/2022   HSV (herpes simplex virus) infection 11/09/2022   Moderate persistent asthma without complication 07/19/2017   Migraine without aura and without status migrainosus, not intractable 12/04/2013    Past Surgical History:  Procedure Laterality Date   COLONOSCOPY  08-04-2016  dr stark   DILATION AND CURETTAGE OF UTERUS  11/21/2022   ESOPHAGOGASTRODUODENOSCOPY  06/20/2016   LAPAROSCOPIC APPENDECTOMY  07/16/2009   MEDIAL PATELLOFEMORAL LIGAMENT REPAIR Right 10/31/2016   Procedure: Right knee arthroscopic assisted medial patella femoral ligament reconstruction;  Surgeon: Janeth Medicus, MD;  Location: [facility];  Service: Orthopedics;  Laterality: Right;   PILONIDAL CYST EXCISION N/A 07/02/2014   Procedure: EXCISION OF PILONIDAL  CYST WITH PRIMARY CLOSURE;  Surgeon: Melven Stable. Alanda Allegra, MD;  Location: [facility];  Service: Pediatrics;  Laterality: N/A;   PILONIDAL CYST EXCISION N/A 10/22/2014   Procedure: EXCISION PILONIDAL CYST AND SINUS;  Surgeon: Alanda Allegra, MD;  Location: [facility];  Service: Pediatrics;  Laterality: N/A;  Re-excision   WISDOM TOOTH EXTRACTION      OB History     Gravida  2   Para      Term      Preterm      AB  1   Living         SAB      IAB  1   Ectopic      Multiple      Live Births           Obstetric Comments  Surgical AB both 24 and 26          Home Medications    Prior to Admission medications   Medication Sig Start Date End Date Taking? Authorizing Provider  albuterol  (PROVENTIL ) (5 MG/ML) 0.5% nebulizer solution Take 0.5 mLs (2.5 mg total) by nebulization every 6 (six) hours as needed for wheezing or shortness of breath. 08/14/23   Adelia Homestead, MD  albuterol  (VENTOLIN  HFA) 108 [phone] Base) MCG/ACT inhaler Inhale 1-2 puffs into the lungs every 6 (six) hours as needed for wheezing or shortness of breath. 08/14/23   Adelia Homestead, MD  clindamycin  (CLINDAGEL) 1 % gel Apply topically 2 (two) times daily. 08/14/23  Adelia Homestead, MD  fluconazole  (DIFLUCAN ) 150 MG tablet Take 1 tablet (150 mg total) by mouth every 3 (three) days as needed. 07/09/23   Burnette, Jennifer M, PA-C  ibuprofen  (ADVIL ) 800 MG tablet Take 1 tablet (800 mg total) by mouth every 8 (eight) hours as needed for moderate pain. 11/23/22   Leftwich-Kirby, Darren Em, CNM  norgestimate-ethinyl estradiol (ORTHO-CYCLEN) 0.25-35 MG-MCG tablet Take 1 tablet by mouth daily.    [provider]  valACYclovir  (VALTREX ) 1000 MG tablet Take 1 tablet (1,000 mg total) by mouth daily. 11/09/22   Adelia Homestead, MD    Family History Family History  Problem Relation Age of Onset   Depression Mother    Anxiety disorder Mother    Bipolar disorder  Mother    Asthma Father    Diabetes Maternal Grandmother    Hypertension Maternal Grandmother    Heart disease Maternal Grandmother        hx. open heart surgery   Colon cancer Neg Hx    Esophageal cancer Neg Hx     Social History Social History   Tobacco Use   Smoking status: Never   Smokeless tobacco: Never   Tobacco comments:    Vaping 2018  Vaping Use   Vaping status: Former  Substance Use Topics   Alcohol use: Not Currently    Comment: socially   Drug use: No     Allergies   Patient has no known allergies.   Review of Systems Review of Systems  Genitourinary:  Positive for vaginal discharge.   Per HPI  Physical Exam Triage Vital Signs ED Triage Vitals  Encounter Vitals Group     BP 09/13/23 1541 110/75     Systolic BP Percentile --      Diastolic BP Percentile --      Pulse Rate 09/13/23 1541 71     Resp 09/13/23 1541 16     Temp 09/13/23 1541 98.5 F (36.9 C)     Temp Source 09/13/23 1541 Oral     SpO2 09/13/23 1541 98 %     Weight --      Height --      Head Circumference --      Peak Flow --      Pain Score 09/13/23 1543 0     Pain Loc --      Pain Education --      Exclude from Growth Chart --    No data found.  Updated Vital Signs BP 110/75 (BP Location: Left Arm)   Pulse 71   Temp 98.5 F (36.9 C) (Oral)   Resp 16   LMP 08/23/2023 (Approximate)   SpO2 98%   Visual Acuity Right Eye Distance:   Left Eye Distance:   Bilateral Distance:    Right Eye Near:   Left Eye Near:    Bilateral Near:     Physical Exam Vitals and nursing note reviewed.  Constitutional:      General: She is awake. She is not in acute distress.    Appearance: Normal appearance. She is well-developed and well-groomed. She is not ill-appearing.  Genitourinary:    Comments: Exam deferred Skin:    General: Skin is warm and dry.  Neurological:     Mental Status: She is alert.  Psychiatric:        Behavior: Behavior is cooperative.      UC Treatments  / Results  Labs (all labs ordered are listed, but only abnormal results are displayed)  Labs Reviewed  HIV ANTIBODY (ROUTINE TESTING W REFLEX)  RPR  CERVICOVAGINAL ANCILLARY ONLY    EKG   Radiology No results found.  Procedures Procedures (including critical care time)  Medications Ordered in UC Medications - No data to display  Initial Impression / Assessment and Plan / UC Course  I have reviewed the triage vital signs and the nursing notes.  Pertinent labs & imaging results that were available during my care of the patient were reviewed by me and considered in my medical decision making (see chart for details).     Patient is well-appearing.  Vitals stable.  GU exam deferred.  Patient perform self swab for STD/STI.  HIV and RPR ordered.  Discussed follow-up and return cautions. Final Clinical Impressions(s) / UC Diagnoses   Final diagnoses:  Vaginal discharge  Screening for STD (sexually transmitted disease)     Discharge Instructions      Your results will come back over the next few days and someone will call if results are positive and require treatment.  Return here as needed.     ED Prescriptions   None    PDMP not reviewed this encounter.   Levora Reas A, NP 09/13/23 1623

## 2023-09-13 NOTE — Discharge Instructions (Signed)
 Your results will come back over the next few days and someone will call if results are positive and require treatment.  Return here as needed.

## 2023-09-14 LAB — CERVICOVAGINAL ANCILLARY ONLY
Bacterial Vaginitis (gardnerella): NEGATIVE
Candida Glabrata: NEGATIVE
Candida Vaginitis: POSITIVE — AB
Chlamydia: NEGATIVE
Comment: NEGATIVE
Comment: NEGATIVE
Comment: NEGATIVE
Comment: NEGATIVE
Comment: NEGATIVE
Comment: NORMAL
Neisseria Gonorrhea: NEGATIVE
Trichomonas: NEGATIVE

## 2023-09-14 LAB — RPR: RPR Ser Ql: NONREACTIVE

## 2023-09-17 ENCOUNTER — Telehealth (HOSPITAL_COMMUNITY): Payer: Self-pay

## 2023-09-17 MED ORDER — FLUCONAZOLE 150 MG PO TABS: 150.0000 mg | ORAL_TABLET | Freq: Once | ORAL | 0 refills | Status: AC

## 2023-09-17 NOTE — Telephone Encounter (Signed)
 Per protocol, pt requires tx with Diflucan.  Rx sent to pharmacy on file.

## 2023-09-27 ENCOUNTER — Other Ambulatory Visit: Payer: Self-pay

## 2023-09-27 ENCOUNTER — Encounter: Payer: Self-pay | Admitting: Internal Medicine

## 2023-09-27 NOTE — Telephone Encounter (Signed)
 Im unable to resend this on behalf of patients request

## 2023-09-28 ENCOUNTER — Other Ambulatory Visit: Payer: Self-pay

## 2023-09-28 NOTE — Telephone Encounter (Signed)
 It will not allow me to resend in for the patient

## 2023-10-01 ENCOUNTER — Telehealth: Payer: Self-pay

## 2023-10-01 ENCOUNTER — Other Ambulatory Visit: Payer: Self-pay

## 2023-10-01 ENCOUNTER — Other Ambulatory Visit (HOSPITAL_COMMUNITY): Payer: Self-pay

## 2023-10-01 DIAGNOSIS — A6004 Herpesviral vulvovaginitis: Secondary | ICD-10-CM

## 2023-10-01 MED ORDER — CLINDAMYCIN PHOS (ONCE-DAILY) 1 % EX GEL
1.0000 | Freq: Every day | CUTANEOUS | 3 refills | Status: DC
  Filled 2023-10-01: qty 75, 30d supply, fill #0

## 2023-10-01 MED ORDER — VALACYCLOVIR HCL 1 G PO TABS
1000.0000 mg | ORAL_TABLET | Freq: Every day | ORAL | 3 refills | Status: DC
  Filled 2023-10-01 – 2023-10-18 (×2): qty 30, 30d supply, fill #0
  Filled 2023-11-12: qty 30, 30d supply, fill #1
  Filled 2023-12-25 – 2024-01-30 (×3): qty 30, 30d supply, fill #2
  Filled 2024-03-24: qty 30, 30d supply, fill #3
  Filled 2024-05-03: qty 90, 90d supply, fill #3
  Filled 2024-05-18: qty 30, 30d supply, fill #3

## 2023-10-01 NOTE — Telephone Encounter (Signed)
 I have pended medication but pharmacy is not listed.

## 2023-10-01 NOTE — Telephone Encounter (Signed)
 Pharmacy has been added.

## 2023-10-01 NOTE — Telephone Encounter (Signed)
 Done

## 2023-10-02 ENCOUNTER — Other Ambulatory Visit (HOSPITAL_COMMUNITY): Payer: Self-pay

## 2023-10-02 MED ORDER — CLINDAMYCIN PHOSPHATE 1 % EX SOLN
Freq: Every day | CUTANEOUS | 11 refills | Status: AC
  Filled 2023-10-02 – 2023-10-18 (×2): qty 30, 30d supply, fill #0
  Filled 2023-10-24 – 2023-11-12 (×3): qty 30, 30d supply, fill #1

## 2023-10-02 NOTE — Telephone Encounter (Signed)
 Sent in alternative which is cost affordable. Pharmacy reached out that the prior clindamycin  rx was $600/month

## 2023-10-11 ENCOUNTER — Other Ambulatory Visit (HOSPITAL_COMMUNITY): Payer: Self-pay

## 2023-10-18 ENCOUNTER — Other Ambulatory Visit (HOSPITAL_COMMUNITY): Payer: Self-pay

## 2023-10-22 ENCOUNTER — Ambulatory Visit (INDEPENDENT_AMBULATORY_CARE_PROVIDER_SITE_OTHER): Admitting: Internal Medicine

## 2023-10-22 ENCOUNTER — Other Ambulatory Visit: Payer: Self-pay

## 2023-10-22 ENCOUNTER — Other Ambulatory Visit (HOSPITAL_COMMUNITY): Payer: Self-pay

## 2023-10-22 VITALS — BP 112/82 | HR 59 | Temp 98.8°F | Ht 61.0 in | Wt 173.0 lb

## 2023-10-22 DIAGNOSIS — F909 Attention-deficit hyperactivity disorder, unspecified type: Secondary | ICD-10-CM | POA: Diagnosis not present

## 2023-10-22 MED ORDER — AMPHETAMINE-DEXTROAMPHETAMINE 5 MG PO TABS
5.0000 mg | ORAL_TABLET | Freq: Every day | ORAL | 0 refills | Status: DC
  Filled 2023-10-22: qty 30, 30d supply, fill #0

## 2023-10-22 NOTE — Patient Instructions (Signed)
 We have sent in the adderall

## 2023-10-22 NOTE — Assessment & Plan Note (Signed)
 Rx adderall 5 mg daily. She will let us  know about if this is working and we can adjust. Follow up 3 months.

## 2023-10-22 NOTE — Progress Notes (Signed)
   Subjective:   Patient ID: Suzanne Wolfe, female    DOB: 1995/10/11, 28 y.o.   MRN: [id number]  HPI The patient is a 28 YO female coming in for add management. Previous in middle school on adderall and dose was too high. Mom took her off. She is now working as a Engineer, civil (consulting) and noticing attention struggling as well at home with cleaning and abandoning halfway through due to distraction.   Review of Systems  Constitutional: Negative.   HENT: Negative.    Eyes: Negative.   Respiratory:  Negative for cough, chest tightness and shortness of breath.   Cardiovascular:  Negative for chest pain, palpitations and leg swelling.  Gastrointestinal:  Negative for abdominal distention, abdominal pain, constipation, diarrhea, nausea and vomiting.  Musculoskeletal: Negative.   Skin: Negative.   Neurological: Negative.   Psychiatric/Behavioral:  Positive for decreased concentration.     Objective:  Physical Exam Constitutional:      Appearance: She is well-developed.  HENT:     Head: Normocephalic and atraumatic.  Cardiovascular:     Rate and Rhythm: Normal rate and regular rhythm.  Pulmonary:     Effort: Pulmonary effort is normal. No respiratory distress.     Breath sounds: Normal breath sounds. No wheezing or rales.  Abdominal:     General: Bowel sounds are normal. There is no distension.     Palpations: Abdomen is soft.     Tenderness: There is no abdominal tenderness. There is no rebound.  Musculoskeletal:     Cervical back: Normal range of motion.  Skin:    General: Skin is warm and dry.  Neurological:     Mental Status: She is alert and oriented to person, place, and time.     Coordination: Coordination normal.     Vitals:   10/22/23 0930  BP: 112/82  Pulse: (!) 59  Temp: 98.8 F (37.1 C)  TempSrc: Oral  SpO2: 96%  Weight: 173 lb (78.5 kg)  Height: 5\' 1"  (1.549 m)    Assessment & Plan:

## 2023-10-24 ENCOUNTER — Other Ambulatory Visit (HOSPITAL_COMMUNITY): Payer: Self-pay

## 2023-11-06 ENCOUNTER — Other Ambulatory Visit (HOSPITAL_COMMUNITY): Payer: Self-pay

## 2023-11-07 ENCOUNTER — Encounter (HOSPITAL_COMMUNITY): Payer: Self-pay

## 2023-11-07 ENCOUNTER — Ambulatory Visit (HOSPITAL_COMMUNITY)
Admission: EM | Admit: 2023-11-07 | Discharge: 2023-11-07 | Disposition: A | Discharge status: Home / Self Care | Attending: Emergency Medicine | Admitting: Emergency Medicine

## 2023-11-07 DIAGNOSIS — L0501 Pilonidal cyst with abscess: Secondary | ICD-10-CM | POA: Diagnosis not present

## 2023-11-07 DIAGNOSIS — J4521 Mild intermittent asthma with (acute) exacerbation: Secondary | ICD-10-CM

## 2023-11-07 DIAGNOSIS — J069 Acute upper respiratory infection, unspecified: Secondary | ICD-10-CM

## 2023-11-07 MED ORDER — PROMETHAZINE-DM 6.25-15 MG/5ML PO SYRP: 5.0000 mL | ORAL_SOLUTION | Freq: Every evening | ORAL | 0 refills | Status: DC | PRN

## 2023-11-07 MED ORDER — SULFAMETHOXAZOLE-TRIMETHOPRIM 800-160 MG PO TABS: 1.0000 | ORAL_TABLET | Freq: Two times a day (BID) | ORAL | 0 refills | Status: AC

## 2023-11-07 MED ORDER — PREDNISONE 20 MG PO TABS: 40.0000 mg | ORAL_TABLET | Freq: Every day | ORAL | 0 refills | Status: AC

## 2023-11-07 MED ORDER — BENZONATATE 100 MG PO CAPS: 100.0000 mg | ORAL_CAPSULE | Freq: Three times a day (TID) | ORAL | 0 refills | Status: DC

## 2023-11-07 NOTE — ED Triage Notes (Signed)
 Patient here today with c/o cough, fever, SOB, nasal congestion, wheeze, and chest congestion X 3 days. Worsened last night. Woke up with chills and she had to use her nebulizer.   Patient also c/o an abscess on her tailbone since yesterday. Patient has a h/o pilonidal cyst in the same spot.

## 2023-11-07 NOTE — Discharge Instructions (Signed)
 I was your symptoms are likely related to a viral respiratory illness that is now exacerbated your asthma. Start taking prednisone  2 tablets once daily for 5 days to help with asthma exacerbation Continue with albuterol  inhaler and nebulizers as needed for wheezing, shortness of breath, and chest tightness. You can take Tessalon  every 8 hours as needed for cough. Take Promethazine  DM cough syrup at bedtime as needed for cough.  This can make you drowsy so do not drive, work, or drink alcohol while taking this. Otherwise you can take Mucinex  as needed for cough and congestion. Alternate between Tylenol  and ibuprofen  as needed for pain and fever.  For your abscess start taking Bactrim  twice daily for 7 days for infection coverage. Apply warm compresses to this area to help promote additional drainage. Keep the area clean dry and covered. Return here if you notice any spreading of redness, increased swelling, or lots of purulent drainage noted from the area. Follow-up with your primary care provider or return here as needed

## 2023-11-07 NOTE — ED Provider Notes (Signed)
 [facility]    CSN: [id number] Arrival date & time: 11/07/23  1922      History   Chief Complaint Chief Complaint  Patient presents with   Cyst   Cough    HPI Suzanne Wolfe is a 28 y.o. female.   Patient presents with cough, congestion, shortness of breath, wheezing, chest congestion, and fever x 3 days.  Patient states that her symptoms worsened last night.  Patient reports history of asthma.  Patient states that she had to use her nebulizer last night due to wheezing and shortness of breath.  Patient states that she did get some relief from the nebulizer.  Denies chest pain, nausea, vomiting, diarrhea, and abdominal pain.  Patient states that she has been taking NyQuil with some relief of her symptoms as well.  Patient also states that she noticed an abscess on her tailbone yesterday.  Patient states that she has a history of a pilonidal cyst with abscess in the same spot.  Patient states that she previously had surgery to remove this in the past.  Patient denies noticing any drainage from this area.  Patient states that it is tender and sore to touch.  The history is provided by the patient and medical records.  Cough   Past Medical History:  Diagnosis Date   Anxiety    thereapy, no meds   Herpes    History of Clostridium difficile infection 02/ 2018  resolve w/ antibiotic   Mild asthma    Patellar instability of right knee    UTI (urinary tract infection)     Patient Active Problem List   Diagnosis Date Noted   Acne 08/14/2023   ADD (attention deficit disorder) 11/09/2022   HSV (herpes simplex virus) infection 11/09/2022   Moderate persistent asthma without complication 07/19/2017   Migraine without aura and without status migrainosus, not intractable 12/04/2013    Past Surgical History:  Procedure Laterality Date   COLONOSCOPY  08-04-2016  dr stark   DILATION AND CURETTAGE OF UTERUS  11/21/2022   ESOPHAGOGASTRODUODENOSCOPY  06/20/2016    LAPAROSCOPIC APPENDECTOMY  07/16/2009   MEDIAL PATELLOFEMORAL LIGAMENT REPAIR Right 10/31/2016   Procedure: Right knee arthroscopic assisted medial patella femoral ligament reconstruction;  Surgeon: Sharl Selinda Dover, MD;  Location: [facility];  Service: Orthopedics;  Laterality: Right;   PILONIDAL CYST EXCISION N/A 07/02/2014   Procedure: EXCISION OF PILONIDAL CYST WITH PRIMARY CLOSURE;  Surgeon: CHRISTELLA. Julietta Millman, MD;  Location: [facility];  Service: Pediatrics;  Laterality: N/A;   PILONIDAL CYST EXCISION N/A 10/22/2014   Procedure: EXCISION PILONIDAL CYST AND SINUS;  Surgeon: Julietta Millman, MD;  Location: [facility];  Service: Pediatrics;  Laterality: N/A;  Re-excision   WISDOM TOOTH EXTRACTION      OB History     Gravida  2   Para      Term      Preterm      AB  1   Living         SAB      IAB  1   Ectopic      Multiple      Live Births           Obstetric Comments  Surgical AB both 24 and 26          Home Medications    Prior to Admission medications   Medication Sig Start Date End Date Taking? Authorizing Provider  benzonatate  (TESSALON ) 100 MG capsule  Take 1 capsule (100 mg total) by mouth every 8 (eight) hours. 11/07/23  Yes Johnie Flaming A, NP  predniSONE  (DELTASONE ) 20 MG tablet Take 2 tablets (40 mg total) by mouth daily for 5 days. 11/07/23 11/12/23 Yes Johnie Flaming A, NP  promethazine -dextromethorphan (PROMETHAZINE -DM) 6.25-15 MG/5ML syrup Take 5 mLs by mouth at bedtime as needed for cough. 11/07/23  Yes Johnie, Gurpreet Mariani A, NP  sulfamethoxazole -trimethoprim  (BACTRIM  DS) 800-160 MG tablet Take 1 tablet by mouth 2 (two) times daily for 7 days. 11/07/23 11/14/23 Yes Johnie, Netha Dafoe A, NP  albuterol  (PROVENTIL ) (5 MG/ML) 0.5% nebulizer solution Take 0.5 mLs (2.5 mg total) by nebulization every 6 (six) hours as needed for wheezing or shortness of breath. 08/14/23   Rollene Almarie LABOR, MD  albuterol   (VENTOLIN  HFA) [phone] Base) MCG/ACT inhaler Inhale 1-2 puffs into the lungs every 6 (six) hours as needed for wheezing or shortness of breath. 08/14/23   Rollene Almarie LABOR, MD  amphetamine -dextroamphetamine  (ADDERALL) 5 MG tablet Take 1 tablet (5 mg total) by mouth daily. 10/22/23   Rollene Almarie LABOR, MD  clindamycin  (CLEOCIN  T) 1 % external solution Apply topically daily. 10/02/23   Rollene Almarie LABOR, MD  norgestimate-ethinyl estradiol (ORTHO-CYCLEN) 0.25-35 MG-MCG tablet Take 1 tablet by mouth daily.    [provider]  valACYclovir  (VALTREX ) 1000 MG tablet Take 1 tablet (1,000 mg total) by mouth daily. 10/01/23   Rollene Almarie LABOR, MD    Family History Family History  Problem Relation Age of Onset   Depression Mother    Anxiety disorder Mother    Bipolar disorder Mother    Asthma Father    Diabetes Maternal Grandmother    Hypertension Maternal Grandmother    Heart disease Maternal Grandmother        hx. open heart surgery   Colon cancer Neg Hx    Esophageal cancer Neg Hx     Social History Social History   Tobacco Use   Smoking status: Never   Smokeless tobacco: Never   Tobacco comments:    Vaping 2018  Vaping Use   Vaping status: Former  Substance Use Topics   Alcohol use: Not Currently    Comment: socially   Drug use: No     Allergies   Patient has no known allergies.   Review of Systems Review of Systems  Respiratory:  Positive for cough.    Per HPI  Physical Exam Triage Vital Signs ED Triage Vitals  Encounter Vitals Group     BP 11/07/23 2017 111/77     Girls Systolic BP Percentile --      Girls Diastolic BP Percentile --      Boys Systolic BP Percentile --      Boys Diastolic BP Percentile --      Pulse Rate 11/07/23 2017 100     Resp 11/07/23 2017 16     Temp 11/07/23 2017 99 F (37.2 C)     Temp Source 11/07/23 2017 Oral     SpO2 11/07/23 2017 99 %     Weight --      Height --      Head Circumference --      Peak Flow --       Pain Score 11/07/23 2021 4     Pain Loc --      Pain Education --      Exclude from Growth Chart --    No data found.  Updated Vital Signs BP 111/77 (BP Location: Left Arm)  Pulse 100   Temp 99 F (37.2 C) (Oral)   Resp 16   LMP 10/23/2023 (Exact Date)   SpO2 99%   Visual Acuity Right Eye Distance:   Left Eye Distance:   Bilateral Distance:    Right Eye Near:   Left Eye Near:    Bilateral Near:     Physical Exam Vitals and nursing note reviewed.  Constitutional:      General: She is awake. She is not in acute distress.    Appearance: Normal appearance. She is well-developed and well-groomed. She is not ill-appearing.  HENT:     Right Ear: Tympanic membrane, ear canal and external ear normal.     Left Ear: Tympanic membrane, ear canal and external ear normal.     Nose: Congestion and rhinorrhea present.     Mouth/Throat:     Mouth: Mucous membranes are moist.     Pharynx: Posterior oropharyngeal erythema present. No oropharyngeal exudate.   Cardiovascular:     Rate and Rhythm: Normal rate and regular rhythm.  Pulmonary:     Effort: Pulmonary effort is normal.     Breath sounds: Normal breath sounds.   Skin:    General: Skin is warm and dry.     Findings: Abscess present.     Comments: Approximately half centimeter in diameter fluctuant abscess noted to pilonidal region.  Without surrounding erythema or swelling.   Neurological:     Mental Status: She is alert.   Psychiatric:        Behavior: Behavior is cooperative.      UC Treatments / Results  Labs (all labs ordered are listed, but only abnormal results are displayed) Labs Reviewed - No data to display  EKG   Radiology No results found.  Procedures Procedures (including critical care time)  Medications Ordered in UC Medications - No data to display  Initial Impression / Assessment and Plan / UC Course  I have reviewed the triage vital signs and the nursing notes.  Pertinent labs &  imaging results that were available during my care of the patient were reviewed by me and considered in my medical decision making (see chart for details).     Patient is overall well-appearing.  Vitals are stable.  Upon assessment congestion and rhinorrhea are present, mild erythema and PND noted to pharynx.  Lungs clear bilaterally on auscultation at this time.  Symptoms likely viral in nature.  Prescribed prednisone  to assist with asthma exacerbation.  Discussed use of albuterol  inhaler or nebulizers.  Prescribed Tessalon  and Promethazine  DM as needed for cough.  Discussed over-the-counter medication as needed for symptoms.  Also upon assessment patient had an approximately half centimeter in diameter fluctuant abscess noted to the pilonidal region.  Asked patient if she was okay with me doing a quick drainage without local anesthesia at the bedside.  Patient is agreeable to this.  Popped open abscess by squeezing at the bedside and drained out a moderate amount of thick purulent drainage.  This was successful and patient tolerated this well without local anesthesia.  Prescribed Bactrim  for abscess coverage.  Discussed follow-up and return precautions. Final Clinical Impressions(s) / UC Diagnoses   Final diagnoses:  Pilonidal abscess  Mild intermittent asthma with acute exacerbation  Viral URI with cough     Discharge Instructions      I was your symptoms are likely related to a viral respiratory illness that is now exacerbated your asthma. Start taking prednisone  2 tablets once daily for 5  days to help with asthma exacerbation Continue with albuterol  inhaler and nebulizers as needed for wheezing, shortness of breath, and chest tightness. You can take Tessalon  every 8 hours as needed for cough. Take Promethazine  DM cough syrup at bedtime as needed for cough.  This can make you drowsy so do not drive, work, or drink alcohol while taking this. Otherwise you can take Mucinex  as needed for  cough and congestion. Alternate between Tylenol  and ibuprofen  as needed for pain and fever.  For your abscess start taking Bactrim  twice daily for 7 days for infection coverage. Apply warm compresses to this area to help promote additional drainage. Keep the area clean dry and covered. Return here if you notice any spreading of redness, increased swelling, or lots of purulent drainage noted from the area. Follow-up with your primary care provider or return here as needed    ED Prescriptions     Medication Sig Dispense Auth. Provider   predniSONE  (DELTASONE ) 20 MG tablet Take 2 tablets (40 mg total) by mouth daily for 5 days. 10 tablet Johnie Flaming A, NP   sulfamethoxazole -trimethoprim  (BACTRIM  DS) 800-160 MG tablet Take 1 tablet by mouth 2 (two) times daily for 7 days. 14 tablet Johnie, Tierra Divelbiss A, NP   benzonatate  (TESSALON ) 100 MG capsule Take 1 capsule (100 mg total) by mouth every 8 (eight) hours. 21 capsule Johnie Flaming A, NP   promethazine -dextromethorphan (PROMETHAZINE -DM) 6.25-15 MG/5ML syrup Take 5 mLs by mouth at bedtime as needed for cough. 118 mL Johnie Flaming A, NP      PDMP not reviewed this encounter.   Johnie Flaming A, NP 11/07/23 2047

## 2023-11-08 ENCOUNTER — Encounter: Payer: Self-pay | Admitting: Internal Medicine

## 2023-11-08 DIAGNOSIS — J4540 Moderate persistent asthma, uncomplicated: Secondary | ICD-10-CM

## 2023-11-09 ENCOUNTER — Other Ambulatory Visit: Payer: Self-pay

## 2023-11-09 ENCOUNTER — Other Ambulatory Visit (HOSPITAL_COMMUNITY): Payer: Self-pay

## 2023-11-09 ENCOUNTER — Ambulatory Visit: Admitting: Internal Medicine

## 2023-11-09 DIAGNOSIS — J4540 Moderate persistent asthma, uncomplicated: Secondary | ICD-10-CM

## 2023-11-09 MED ORDER — ALBUTEROL SULFATE (2.5 MG/3ML) 0.083% IN NEBU
2.5000 mg | INHALATION_SOLUTION | Freq: Four times a day (QID) | RESPIRATORY_TRACT | 12 refills | Status: DC | PRN
  Filled 2023-11-09: qty 150, 13d supply, fill #0

## 2023-11-09 MED ORDER — ALBUTEROL SULFATE HFA 108 (90 BASE) MCG/ACT IN AERS
1.0000 | INHALATION_SPRAY | Freq: Four times a day (QID) | RESPIRATORY_TRACT | 3 refills | Status: AC | PRN
  Filled 2023-11-09: qty 6.7, 17d supply, fill #0

## 2023-11-09 MED ORDER — ALBUTEROL SULFATE (2.5 MG/3ML) 0.083% IN NEBU: 2.5000 mg | INHALATION_SOLUTION | Freq: Once | RESPIRATORY_TRACT | Status: DC

## 2023-11-09 MED ORDER — ALBUTEROL SULFATE (2.5 MG/3ML) 0.083% IN NEBU: 2.5000 mg | INHALATION_SOLUTION | Freq: Four times a day (QID) | RESPIRATORY_TRACT | 1 refills | Status: AC | PRN

## 2023-11-09 NOTE — Addendum Note (Signed)
 Addended by: Cecillia Menees S on: 11/09/2023 09:34 AM   Modules accepted: Orders

## 2023-11-18 ENCOUNTER — Encounter: Payer: Self-pay | Admitting: Internal Medicine

## 2023-11-19 NOTE — Telephone Encounter (Signed)
 Copied from CRM [id number]. Topic: Clinical - Medication Question >> Nov 19, 2023  1:41 PM Mercedes MATSU wrote: Reason for CRM: Patient called in stating that she is running low on her medication amphetamine -dextroamphetamine  (ADDERALL) 5 MG tablet, and she was having a conversation with Dr. Rollene through mychart about changing her dosage. Patient states that her medication is running low, and she wants to know if the medication will be changed and if som she would need a refill and have it sent to her current pharmacy on file. Patients call back number is [phone].

## 2023-11-20 ENCOUNTER — Other Ambulatory Visit (HOSPITAL_COMMUNITY): Payer: Self-pay

## 2023-11-20 MED ORDER — AMPHETAMINE-DEXTROAMPHET ER 10 MG PO CP24
10.0000 mg | ORAL_CAPSULE | Freq: Every day | ORAL | 0 refills | Status: DC
  Filled 2023-11-20: qty 30, 30d supply, fill #0

## 2023-11-22 ENCOUNTER — Other Ambulatory Visit (HOSPITAL_COMMUNITY): Payer: Self-pay

## 2023-12-25 ENCOUNTER — Other Ambulatory Visit (HOSPITAL_COMMUNITY): Payer: Self-pay

## 2023-12-25 ENCOUNTER — Other Ambulatory Visit: Payer: Self-pay | Admitting: Internal Medicine

## 2023-12-25 MED ORDER — AMPHETAMINE-DEXTROAMPHET ER 10 MG PO CP24
10.0000 mg | ORAL_CAPSULE | Freq: Every day | ORAL | 0 refills | Status: DC
  Filled 2023-12-25 – 2024-01-04 (×2): qty 30, 30d supply, fill #0

## 2023-12-25 NOTE — Telephone Encounter (Signed)
 Name of Medication: Adderall 10mg  Name of Pharmacy: [facility] Last Fill or Written Date and Quantity: 11/20/23 30cap 0refills Last Office Visit and Type: 10/22/23 for ADHD Next Office Visit and Type: none Last Controlled Substance Agreement Date: none Last UDS: none

## 2024-01-03 ENCOUNTER — Other Ambulatory Visit (HOSPITAL_COMMUNITY): Payer: Self-pay

## 2024-01-04 ENCOUNTER — Other Ambulatory Visit (HOSPITAL_COMMUNITY): Payer: Self-pay

## 2024-01-04 ENCOUNTER — Encounter (HOSPITAL_COMMUNITY): Payer: Self-pay

## 2024-01-07 ENCOUNTER — Other Ambulatory Visit (HOSPITAL_COMMUNITY): Payer: Self-pay

## 2024-01-16 ENCOUNTER — Other Ambulatory Visit (HOSPITAL_COMMUNITY): Payer: Self-pay

## 2024-01-31 ENCOUNTER — Other Ambulatory Visit (HOSPITAL_COMMUNITY): Payer: Self-pay

## 2024-02-04 ENCOUNTER — Other Ambulatory Visit (HOSPITAL_COMMUNITY): Payer: Self-pay

## 2024-02-04 ENCOUNTER — Other Ambulatory Visit: Payer: Self-pay | Admitting: Internal Medicine

## 2024-02-06 ENCOUNTER — Other Ambulatory Visit (HOSPITAL_COMMUNITY): Payer: Self-pay

## 2024-02-06 MED ORDER — AMPHETAMINE-DEXTROAMPHET ER 10 MG PO CP24
10.0000 mg | ORAL_CAPSULE | Freq: Every day | ORAL | 0 refills | Status: DC
  Filled 2024-02-06: qty 30, 30d supply, fill #0

## 2024-02-07 ENCOUNTER — Other Ambulatory Visit (HOSPITAL_COMMUNITY): Payer: Self-pay

## 2024-02-19 ENCOUNTER — Other Ambulatory Visit (HOSPITAL_COMMUNITY): Payer: Self-pay

## 2024-02-19 MED ORDER — IBUPROFEN 800 MG PO TABS
800.0000 mg | ORAL_TABLET | Freq: Four times a day (QID) | ORAL | 1 refills | Status: AC | PRN
  Filled 2024-02-19: qty 21, 6d supply, fill #0

## 2024-02-19 MED ORDER — AMOXICILLIN 500 MG PO CAPS
500.0000 mg | ORAL_CAPSULE | Freq: Three times a day (TID) | ORAL | 1 refills | Status: DC
  Filled 2024-02-19: qty 21, 7d supply, fill #0

## 2024-02-25 ENCOUNTER — Telehealth: Payer: Self-pay | Admitting: Family Medicine

## 2024-02-25 ENCOUNTER — Encounter: Payer: Self-pay | Admitting: Family Medicine

## 2024-02-25 DIAGNOSIS — Z91199 Patient's noncompliance with other medical treatment and regimen due to unspecified reason: Secondary | ICD-10-CM

## 2024-02-25 NOTE — Progress Notes (Signed)
 The patient no-showed for appointment despite this provider sending direct link, reaching out via phone with no response and waiting for at least 10 minutes from appointment time for patient to join. They will be marked as a NS for this appointment/time.   Suzanne Finner, NP

## 2024-02-29 ENCOUNTER — Other Ambulatory Visit (HOSPITAL_COMMUNITY): Payer: Self-pay

## 2024-02-29 MED ORDER — FLUCONAZOLE 150 MG PO TABS
150.0000 mg | ORAL_TABLET | Freq: Every day | ORAL | 0 refills | Status: DC
  Filled 2024-02-29: qty 2, 4d supply, fill #0

## 2024-03-11 ENCOUNTER — Other Ambulatory Visit (HOSPITAL_COMMUNITY): Payer: Self-pay

## 2024-03-11 MED ORDER — FLUZONE 0.5 ML IM SUSY
0.5000 mL | PREFILLED_SYRINGE | Freq: Once | INTRAMUSCULAR | 0 refills | Status: AC
  Filled 2024-03-11: qty 0.5, 1d supply, fill #0

## 2024-03-24 ENCOUNTER — Other Ambulatory Visit (HOSPITAL_COMMUNITY): Payer: Self-pay

## 2024-03-24 ENCOUNTER — Other Ambulatory Visit: Payer: Self-pay | Admitting: Internal Medicine

## 2024-03-25 NOTE — Telephone Encounter (Signed)
 Due for follow up please schedule and then route back to me okay to fill until visit

## 2024-03-26 NOTE — Telephone Encounter (Signed)
 LVM for pt to call office

## 2024-03-28 ENCOUNTER — Other Ambulatory Visit (HOSPITAL_COMMUNITY): Payer: Self-pay

## 2024-03-28 MED ORDER — AMPHETAMINE-DEXTROAMPHET ER 10 MG PO CP24
10.0000 mg | ORAL_CAPSULE | Freq: Every day | ORAL | 0 refills | Status: DC
  Filled 2024-03-28: qty 30, 30d supply, fill #0

## 2024-04-02 ENCOUNTER — Ambulatory Visit: Payer: Self-pay | Admitting: Internal Medicine

## 2024-04-03 ENCOUNTER — Other Ambulatory Visit (HOSPITAL_COMMUNITY): Payer: Self-pay

## 2024-04-28 ENCOUNTER — Ambulatory Visit: Admitting: Physician Assistant

## 2024-05-04 ENCOUNTER — Other Ambulatory Visit (HOSPITAL_COMMUNITY): Payer: Self-pay

## 2024-05-05 ENCOUNTER — Other Ambulatory Visit: Payer: Self-pay

## 2024-05-16 ENCOUNTER — Other Ambulatory Visit (HOSPITAL_COMMUNITY): Payer: Self-pay

## 2024-05-19 ENCOUNTER — Ambulatory Visit: Payer: Self-pay | Admitting: Internal Medicine

## 2024-05-19 ENCOUNTER — Encounter: Payer: Self-pay | Admitting: Internal Medicine

## 2024-05-19 ENCOUNTER — Other Ambulatory Visit (HOSPITAL_COMMUNITY)
Admission: RE | Admit: 2024-05-19 | Discharge: 2024-05-19 | Disposition: A | Discharge status: Home / Self Care | Source: Ambulatory Visit | Attending: Internal Medicine | Admitting: Internal Medicine

## 2024-05-19 ENCOUNTER — Other Ambulatory Visit (HOSPITAL_COMMUNITY): Payer: Self-pay

## 2024-05-19 VITALS — BP 104/70 | HR 71 | Temp 98.6°F | Ht 61.0 in | Wt 156.4 lb

## 2024-05-19 DIAGNOSIS — N898 Other specified noninflammatory disorders of vagina: Secondary | ICD-10-CM | POA: Diagnosis not present

## 2024-05-19 DIAGNOSIS — F909 Attention-deficit hyperactivity disorder, unspecified type: Secondary | ICD-10-CM | POA: Diagnosis not present

## 2024-05-19 DIAGNOSIS — B009 Herpesviral infection, unspecified: Secondary | ICD-10-CM

## 2024-05-19 LAB — CBC
HCT: 41.3 % (ref 36.0–46.0)
Hemoglobin: 13.7 g/dL (ref 12.0–15.0)
MCHC: 33.2 g/dL (ref 30.0–36.0)
MCV: 83.1 fl (ref 78.0–100.0)
Platelets: 239 K/uL (ref 150.0–400.0)
RBC: 4.98 Mil/uL (ref 3.87–5.11)
RDW: 13.2 % (ref 11.5–15.5)
WBC: 6.6 K/uL (ref 4.0–10.5)

## 2024-05-19 LAB — LIPID PANEL
Cholesterol: 200 mg/dL (ref 28–200)
HDL: 61.4 mg/dL
LDL Cholesterol: 127 mg/dL — ABNORMAL HIGH (ref 10–99)
NonHDL: 138.93
Total CHOL/HDL Ratio: 3
Triglycerides: 62 mg/dL (ref 10.0–149.0)
VLDL: 12.4 mg/dL (ref 0.0–40.0)

## 2024-05-19 LAB — COMPREHENSIVE METABOLIC PANEL WITH GFR
ALT: 20 U/L (ref 3–35)
AST: 21 U/L (ref 5–37)
Albumin: 4.2 g/dL (ref 3.5–5.2)
Alkaline Phosphatase: 39 U/L (ref 39–117)
BUN: 18 mg/dL (ref 6–23)
CO2: 28 meq/L (ref 19–32)
Calcium: 9.1 mg/dL (ref 8.4–10.5)
Chloride: 104 meq/L (ref 96–112)
Creatinine, Ser: 0.71 mg/dL (ref 0.40–1.20)
GFR: 117.56 mL/min
Glucose, Bld: 90 mg/dL (ref 70–99)
Potassium: 4.1 meq/L (ref 3.5–5.1)
Sodium: 137 meq/L (ref 135–145)
Total Bilirubin: 0.5 mg/dL (ref 0.2–1.2)
Total Protein: 7.5 g/dL (ref 6.0–8.3)

## 2024-05-19 MED ORDER — VALACYCLOVIR HCL 1 G PO TABS
1000.0000 mg | ORAL_TABLET | Freq: Every day | ORAL | 3 refills | Status: AC
  Filled 2024-05-19: qty 90, 90d supply, fill #0

## 2024-05-19 MED ORDER — AMPHETAMINE-DEXTROAMPHET ER 10 MG PO CP24
10.0000 mg | ORAL_CAPSULE | Freq: Every day | ORAL | 0 refills | Status: AC
  Filled 2024-05-19: qty 90, 90d supply, fill #0

## 2024-05-19 NOTE — Patient Instructions (Signed)
 We will check the swab today and the labs.

## 2024-05-19 NOTE — Progress Notes (Signed)
 "  Subjective:   Patient ID: Suzanne Wolfe, female    DOB: 1996-05-11, 29 y.o.   MRN: [id number]  Discussed the use of AI scribe software for clinical note transcription with the patient, who gave verbal consent to proceed.  History of Present Illness Suzanne Wolfe is a 29 year old female who presents for follow-up regarding her Adderall medication and concerns about a possible vaginal infection.  She finds the 10 mg dose of Adderall effective, providing better coverage throughout the day without significant downsides. She notes a decrease in appetite but maintains adequate nutrition. She feels dehydrated due to reduced water intake and increased work demands, leading to muscle twitching last week. She takes Adderall on non-working days if she has plans, but not otherwise. No heart racing associated with Adderall use.  She is concerned about a change in vaginal odor, describing it as stronger and different than usual.   She is currently experiencing a herpes outbreak, which began two days ago. She has not taken her medication for two to three weeks. She attributes the outbreak to stress and recent waxing and exfoliation. She has not had an outbreak in a while.   She has been active, going to the gym, and has lost about 15 to 17 pounds since the summer.  Review of Systems  Constitutional:  Positive for appetite change.  HENT: Negative.    Eyes: Negative.   Respiratory:  Negative for cough, chest tightness and shortness of breath.   Cardiovascular:  Negative for chest pain, palpitations and leg swelling.  Gastrointestinal:  Negative for abdominal distention, abdominal pain, constipation, diarrhea, nausea and vomiting.  Genitourinary:  Negative for pelvic pain, vaginal discharge and vaginal pain.       Vaginal odor  Musculoskeletal: Negative.   Skin: Negative.   Neurological: Negative.   Psychiatric/Behavioral: Negative.      Objective:  Physical Exam Constitutional:      Appearance:  She is well-developed.  HENT:     Head: Normocephalic and atraumatic.  Cardiovascular:     Rate and Rhythm: Normal rate and regular rhythm.  Pulmonary:     Effort: Pulmonary effort is normal. No respiratory distress.     Breath sounds: Normal breath sounds. No wheezing or rales.  Abdominal:     General: Bowel sounds are normal. There is no distension.     Palpations: Abdomen is soft.     Tenderness: There is no abdominal tenderness.  Musculoskeletal:     Cervical back: Normal range of motion.  Skin:    General: Skin is warm and dry.  Neurological:     Mental Status: She is alert and oriented to person, place, and time.     Coordination: Coordination normal.     Vitals:   05/19/24 0808  BP: 104/70  Pulse: 71  Temp: 98.6 F (37 C)  TempSrc: Oral  SpO2: 98%  Weight: 156 lb 6.4 oz (70.9 kg)  Height: 5' 1 (1.549 m)    Assessment and Plan Assessment & Plan Herpes simplex vulvovaginitis   She is experiencing a current outbreak for two days, possibly triggered by stress or recent waxing and exfoliation. Advised to take Valtrex  twice daily for five days. Valtrex  prescription refilled for one year.   Attention-deficit hyperactivity disorder   The current dose of 10 mg Adderall is effective with better symptom control. Noted a slight decrease in appetite and hydration. Prescribed a 90-day supply of Adderall, pending insurance approval. Checking CMP and CBC and adjust as needed.  Vaginal odor Checking aptima swab for STI and HIV to rule out infection. Treat as appropriate. Counseled to establish for ob/gyn for routine care and pap smear as due.    "

## 2024-05-19 NOTE — Assessment & Plan Note (Signed)
 The current dose of 10 mg Adderall is effective with better symptom control. Noted a slight decrease in appetite and hydration. Prescribed a 90-day supply of Adderall, pending insurance approval. Checking CMP and CBC and adjust as needed.

## 2024-05-19 NOTE — Assessment & Plan Note (Signed)
 She is experiencing a current outbreak for two days, possibly triggered by stress or recent waxing and exfoliation. Advised to take Valtrex  twice daily for five days. Valtrex  prescription refilled for one year.

## 2024-05-19 NOTE — Assessment & Plan Note (Signed)
 Checking aptima swab for STI and HIV to rule out infection. Treat as appropriate. Counseled to establish for ob/gyn for routine care and pap smear as due.

## 2024-05-20 ENCOUNTER — Other Ambulatory Visit (HOSPITAL_COMMUNITY): Payer: Self-pay

## 2024-05-20 LAB — CERVICOVAGINAL ANCILLARY ONLY
Bacterial Vaginitis (gardnerella): POSITIVE — AB
Candida Glabrata: NEGATIVE
Candida Vaginitis: NEGATIVE
Chlamydia: NEGATIVE
Comment: NEGATIVE
Comment: NEGATIVE
Comment: NEGATIVE
Comment: NEGATIVE
Comment: NEGATIVE
Comment: NORMAL
Neisseria Gonorrhea: NEGATIVE
Trichomonas: NEGATIVE

## 2024-05-20 LAB — HIV ANTIBODY (ROUTINE TESTING W REFLEX)
HIV 1&2 Ab, 4th Generation: NONREACTIVE
HIV FINAL INTERPRETATION: NEGATIVE

## 2024-05-20 MED ORDER — METRONIDAZOLE 500 MG PO TABS
500.0000 mg | ORAL_TABLET | Freq: Three times a day (TID) | ORAL | 0 refills | Status: AC
  Filled 2024-05-20: qty 21, 7d supply, fill #0

## 2024-05-20 NOTE — Telephone Encounter (Signed)
 Pt is aware of results through MyChart message.

## 2024-05-21 ENCOUNTER — Other Ambulatory Visit (HOSPITAL_COMMUNITY): Payer: Self-pay

## 2024-05-21 MED ORDER — FLUCONAZOLE 150 MG PO TABS
150.0000 mg | ORAL_TABLET | Freq: Once | ORAL | 0 refills | Status: AC
  Filled 2024-05-21: qty 1, 1d supply, fill #0

## 2024-05-21 NOTE — Telephone Encounter (Signed)
Pt is aware via MyChart message

## 2024-05-29 ENCOUNTER — Other Ambulatory Visit (HOSPITAL_COMMUNITY): Payer: Self-pay

## 2024-10-08 ENCOUNTER — Ambulatory Visit: Admitting: Physician Assistant
